# Patient Record
Sex: Male | Born: 1975 | Race: White | Hispanic: No | Marital: Married | Smoking: Former smoker
Health system: Southern US, Community
[De-identification: ages and names within clinical notes are randomized; demographics above are authoritative.]

## PROBLEM LIST (undated history)

## (undated) DIAGNOSIS — R112 Nausea with vomiting, unspecified: Secondary | ICD-10-CM

## (undated) DIAGNOSIS — T4145XA Adverse effect of unspecified anesthetic, initial encounter: Secondary | ICD-10-CM

## (undated) DIAGNOSIS — S46211A Strain of muscle, fascia and tendon of other parts of biceps, right arm, initial encounter: Secondary | ICD-10-CM

## (undated) DIAGNOSIS — Z9889 Other specified postprocedural states: Secondary | ICD-10-CM

## (undated) DIAGNOSIS — E039 Hypothyroidism, unspecified: Secondary | ICD-10-CM

## (undated) DIAGNOSIS — T8859XA Other complications of anesthesia, initial encounter: Secondary | ICD-10-CM

## (undated) HISTORY — PX: SHOULDER SURGERY: SHX246

---

## 2002-06-25 ENCOUNTER — Ambulatory Visit (HOSPITAL_COMMUNITY): Admission: RE | Admit: 2002-06-25 | Discharge: 2002-06-25 | Payer: Self-pay | Admitting: Family Medicine

## 2002-06-25 ENCOUNTER — Encounter: Payer: Self-pay | Admitting: Family Medicine

## 2002-07-19 ENCOUNTER — Ambulatory Visit (HOSPITAL_COMMUNITY): Admission: RE | Admit: 2002-07-19 | Discharge: 2002-07-19 | Payer: Self-pay | Admitting: Otolaryngology

## 2002-07-19 ENCOUNTER — Encounter: Payer: Self-pay | Admitting: Otolaryngology

## 2002-09-11 ENCOUNTER — Ambulatory Visit (HOSPITAL_COMMUNITY): Admission: RE | Admit: 2002-09-11 | Discharge: 2002-09-11 | Payer: Self-pay | Admitting: Otolaryngology

## 2002-09-11 ENCOUNTER — Encounter: Payer: Self-pay | Admitting: Otolaryngology

## 2004-07-04 ENCOUNTER — Encounter: Admission: RE | Admit: 2004-07-04 | Discharge: 2004-07-04 | Payer: Self-pay | Admitting: Orthopedic Surgery

## 2004-10-07 ENCOUNTER — Ambulatory Visit (HOSPITAL_COMMUNITY): Admission: RE | Admit: 2004-10-07 | Discharge: 2004-10-07 | Payer: Self-pay | Admitting: Family Medicine

## 2008-01-11 ENCOUNTER — Observation Stay (HOSPITAL_COMMUNITY): Admission: EM | Admit: 2008-01-11 | Discharge: 2008-01-12 | Payer: Self-pay | Admitting: Emergency Medicine

## 2009-05-13 ENCOUNTER — Ambulatory Visit (HOSPITAL_COMMUNITY): Admission: RE | Admit: 2009-05-13 | Discharge: 2009-05-13 | Payer: Self-pay | Admitting: Internal Medicine

## 2009-05-16 ENCOUNTER — Ambulatory Visit (HOSPITAL_COMMUNITY): Admission: RE | Admit: 2009-05-16 | Discharge: 2009-05-16 | Payer: Self-pay | Admitting: Internal Medicine

## 2010-11-17 NOTE — Discharge Summary (Signed)
NAMEMARQUINN, Jerry Owens              ACCOUNT NO.:  [id number]   MEDICAL RECORD NO.:  [id number]          PATIENT TYPE:  OBV   LOCATION:  4742                         FACILITY:  [facility]   PHYSICIAN:  [name], MD  DATE OF BIRTH:  1975-10-26   DATE OF ADMISSION:  01/11/2008  DATE OF DISCHARGE:  01/12/2008                               DISCHARGE SUMMARY   CHIEF COMPLAINT:  Diaphoresis and abdominal discomfort.   HOSPITAL COURSE:  1. Diaphoresis and abdominal discomfort.  The patient was ruled out      for acute coronary syndromes/myocardial infarction by serial EKG      tracings and serial cardiac enzymes.  The patient was scheduled for      2-D echo, but he is unwilling to wait for the 2-D echo, and because      the patient is stable he will be discharged now and it is      recommended that 2-D echo be done as an outpatient.  2. Slight elevation on TSH.  The patient is aware of this problem and      I had lengthy discussion with him and his wife in the room about      this result.  It is recommended that the patient have full thyroid      function evaluation.  He wanted to follow up with Dr. Sherwood Gambler for a      repeat of full thyroid function test.  3. Mild dyslipidemia.  The patient was counseled for lifestyle      modification.   DISCHARGE DIAGNOSES:  1. Gastric discomfort.  The patient is ruled out for acute coronary      syndrome.  2. Slight elevation in thyroid stimulating hormone.  3. Mild dyslipidemia.   DISCHARGE MEDICATION:  Advil p.r.n.   DISCHARGE PLAN:  The patient is discharged to follow up with Dr. Sherwood Gambler  as discussed with him to further assess the need for cardiac  stratification as well as the follow up of thyroid function tests and  lipid management.  The patient and his wife are aware and agreeable to  discharge plan and is stable for discharge today.      [name], MD  Electronically Signed     EME/MEDQ  D:  01/12/2008  T:  01/13/2008  Job:   [id number]

## 2010-11-17 NOTE — H&P (Signed)
Jerry Owens, Jerry Owens              ACCOUNT NO.:  [id number]   MEDICAL RECORD NO.:  [id number]          PATIENT TYPE:  OBV   LOCATION:  4742                         FACILITY:  [facility]   PHYSICIAN:  [name], MD  DATE OF BIRTH:  [birth year]   DATE OF ADMISSION:  01/11/2008  DATE OF DISCHARGE:                              HISTORY & PHYSICAL   PRIMARY CARE PHYSICIAN:  Dr. Elfredia Nevins.   CHIEF COMPLAINT:  Chest discomfort.   HISTORY OF PRESENT ILLNESS:  This is a 35 year old pleasant Caucasian  male with past medical history significant for chronic back pains came  in today to the emergency department after calling his primary physician  for chest discomfort.  The patient experienced chest discomfort  yesterday around 4 to 5 o'clock in the afternoon while he is working in  his garden.  The discomfort is associated with shortness of breath and  diaphoresis.  The patient also noticed dizziness and almost near  syncope.  The patient then felt better after a period of more than half  an hour of heavy sweating.  The patient had similar chest discomfort,  excessive sweating, and generalized fatigue around 9 o'clock last night.  Over the night, the patient slept okay but woke up in the morning very  tired and fatigued and then he called his primary physician who advised  him to present to the emergency department.  The chest discomfort is  mainly in the epigastric area.  It is squeezing and stabbing in nature,  8/10 when it is worse.  The condition is worsened by deep breathing and  improves a slight at rest.  The patient has family history of coronary  artery disease in his father's side.   PAST MEDICAL HISTORY:  Significant for on and off back pain.   SOCIAL HISTORY:  Denied ethanol abuse but he stated he is a social  drinker.  No tobacco abuse or drug abuse.   FAMILY HISTORY:  Significant for myocardial infarction in his father at  age of 51.   MEDICATION ALLERGIES:  Not known  to have medication allergy.   MEDICATION:  1. Ansaid.  2. Propecia.   REVIEW OF SYSTEMS:  A 12-point review of systems is noncontributory.  The rest as per HPI.   PHYSICAL EXAMINATION:  VITAL SIGNS:  Temperature 98.3, blood pressure  125/82, pulse 61, and respiratory rate is 17.  HEENT:  Head, atraumatic and normocephalic. Eyes, PERRL.  Mouth moist.  No ulcer.  Some skin change in the skin of the chin and in the mandible  area.  NECK:  Supple.  No JVD.  HEART:  Precordium, first and second heart sound audible.  No added  sounds.  LUNGS:  Bilateral fair air entry.  No adventitious sounds.  ABDOMEN:  Soft and nontender.  Bowel sounds present.  EXTREMITIES:  No lower extremities edema.  NEUROLOGIC:  Alert and oriented x3, moving all his extremities  spontaneously.  GENITOURINARY:  No CVA tenderness.   LABORATORY AND X-RAY:  Hemoglobin is 16.8, hematocrit is 49.1, platelet  count is 293, and white blood count is 5.4.  Sodium 139, potassium 3.9,  chloride 107, BUN 15, creatinine 0.9, glucose 93.  Chest x-ray showing  low lung volumes with mild peribronchial thickening, stable from 2006.  Troponin is less than 0.05, myoglobin is 39.1, CK-MB is 1.1.  EKG is  showing sinus rhythm with ventricular rate of 62 beat/minute.  Premature  atrial contractions.  Normal axis, normal VQRS intervals.   ASSESSMENT AND PLAN:  A 35 year old with chest discomfort, diaphoresis,  and dizziness/near syncope.  He will be admitted for ruling out acute  coronary syndrome.  1. The patient will be admitted to telemetry floor.  2. We will cycle cardiac enzymes.  3. We will have EKG tracings every 8 hours.  4. We will consider 2-D echo.  5. We will check his fasting lipid profile as well as his TSH.  6. For gastrointestinal prophylaxis I will start Protonix.  7. For deep venous thrombosis prophylaxis we will start Lovenox.      [name], MD  Electronically Signed     EME/MEDQ  D:   01/11/2008  T:  01/12/2008  Job:  [id number]   cc:   Madelin Rear. Sherwood Gambler, MD

## 2011-04-01 LAB — DIFFERENTIAL
Basophils Relative: 0
Lymphs Abs: 3
Monocytes Relative: 7
Neutro Abs: 7.4
Neutrophils Relative %: 65

## 2011-04-01 LAB — BASIC METABOLIC PANEL
Calcium: 9.2
GFR calc Af Amer: >60
GFR calc non Af Amer: >60
Glucose, Bld: 73
Potassium: 4.2
Sodium: 139

## 2011-04-01 LAB — POCT I-STAT, CHEM 8
Chloride: 107
Creatinine, Ser: 0.9
Glucose, Bld: 93
HCT: 52
Potassium: 3.9
Sodium: 139

## 2011-04-01 LAB — LIPID PANEL
Cholesterol: 262 — ABNORMAL HIGH
HDL: 32 — ABNORMAL LOW
LDL Cholesterol: 180 — ABNORMAL HIGH
Triglycerides: 248 — ABNORMAL HIGH

## 2011-04-01 LAB — CARDIAC PANEL(CRET KIN+CKTOT+MB+TROPI)
CK, MB: 1
CK, MB: 1.3
Relative Index: 1.1
Total CK: 122
Troponin I: <0.01

## 2011-04-01 LAB — CBC
HCT: 47.3
Hemoglobin: 16
Platelets: 293
RBC: 5.17
RBC: 5.43
WBC: 10.8 — ABNORMAL HIGH
WBC: 11.5 — ABNORMAL HIGH

## 2011-04-01 LAB — CK TOTAL AND CKMB (NOT AT ARMC): Relative Index: 0.9

## 2011-04-01 LAB — POCT CARDIAC MARKERS
CKMB, poc: 1.1
Myoglobin, poc: 39.1
Troponin i, poc: <0.05

## 2011-04-01 LAB — PHOSPHORUS: Phosphorus: 4.1

## 2011-04-01 LAB — TSH: TSH: 5.738 — ABNORMAL HIGH

## 2015-05-09 ENCOUNTER — Other Ambulatory Visit: Payer: Self-pay | Admitting: Orthopaedic Surgery

## 2015-05-09 DIAGNOSIS — M25532 Pain in left wrist: Secondary | ICD-10-CM

## 2015-05-09 DIAGNOSIS — M25522 Pain in left elbow: Secondary | ICD-10-CM

## 2015-05-28 ENCOUNTER — Other Ambulatory Visit: Payer: Self-pay | Admitting: Orthopaedic Surgery

## 2015-05-28 DIAGNOSIS — M25522 Pain in left elbow: Secondary | ICD-10-CM

## 2015-05-28 DIAGNOSIS — M25532 Pain in left wrist: Secondary | ICD-10-CM

## 2015-06-04 ENCOUNTER — Ambulatory Visit
Admission: RE | Admit: 2015-06-04 | Discharge: 2015-06-04 | Disposition: A | Discharge status: Home / Self Care | Payer: Managed Care, Other (non HMO) | Source: Ambulatory Visit | Attending: Orthopaedic Surgery | Admitting: Orthopaedic Surgery

## 2015-06-04 ENCOUNTER — Ambulatory Visit
Admission: RE | Admit: 2015-06-04 | Discharge: 2015-06-04 | Disposition: A | Discharge status: Home / Self Care | Payer: Self-pay | Source: Ambulatory Visit | Attending: Orthopaedic Surgery | Admitting: Orthopaedic Surgery

## 2015-06-04 DIAGNOSIS — M25522 Pain in left elbow: Secondary | ICD-10-CM

## 2015-06-04 DIAGNOSIS — M25532 Pain in left wrist: Secondary | ICD-10-CM

## 2015-06-04 MED ORDER — IOHEXOL 180 MG/ML  SOLN
1.0000 mL | Freq: Once | INTRAMUSCULAR | Status: AC | PRN
  Administered 2015-06-04: 1.5 mL via INTRA_ARTICULAR

## 2015-11-06 ENCOUNTER — Other Ambulatory Visit: Payer: Self-pay | Admitting: Gastroenterology

## 2015-11-18 ENCOUNTER — Other Ambulatory Visit (HOSPITAL_COMMUNITY): Payer: Self-pay | Admitting: Internal Medicine

## 2015-11-18 DIAGNOSIS — R002 Palpitations: Secondary | ICD-10-CM | POA: Insufficient documentation

## 2015-11-18 DIAGNOSIS — F4489 Other dissociative and conversion disorders: Secondary | ICD-10-CM | POA: Insufficient documentation

## 2015-11-18 DIAGNOSIS — R9431 Abnormal electrocardiogram [ECG] [EKG]: Secondary | ICD-10-CM | POA: Insufficient documentation

## 2015-11-19 ENCOUNTER — Other Ambulatory Visit: Payer: Self-pay

## 2015-11-19 ENCOUNTER — Ambulatory Visit (INDEPENDENT_AMBULATORY_CARE_PROVIDER_SITE_OTHER): Payer: Managed Care, Other (non HMO)

## 2015-11-19 ENCOUNTER — Ambulatory Visit (HOSPITAL_COMMUNITY): Payer: Managed Care, Other (non HMO) | Attending: Cardiology

## 2015-11-19 DIAGNOSIS — R002 Palpitations: Secondary | ICD-10-CM

## 2015-11-19 DIAGNOSIS — R9431 Abnormal electrocardiogram [ECG] [EKG]: Secondary | ICD-10-CM

## 2015-11-19 DIAGNOSIS — F4489 Other dissociative and conversion disorders: Secondary | ICD-10-CM

## 2015-11-26 ENCOUNTER — Other Ambulatory Visit: Payer: Self-pay

## 2015-12-04 ENCOUNTER — Ambulatory Visit (INDEPENDENT_AMBULATORY_CARE_PROVIDER_SITE_OTHER): Payer: Managed Care, Other (non HMO) | Admitting: Neurology

## 2015-12-04 DIAGNOSIS — R410 Disorientation, unspecified: Secondary | ICD-10-CM | POA: Diagnosis not present

## 2015-12-04 NOTE — Procedures (Signed)
    History:  Jerry Owens is a 40 year old gentleman with a history of episodes of confusion. The patient will blanch in the face, and be diaphoretic, he reports palpitations of the heart. No actual loss of consciousness is described. The patient is being evaluated for these events.  This is a routine EEG. No skull defects are noted. Medications include finasteride, and Synthroid.   EEG classification: Normal awake  Description of the recording: The background rhythms of this recording consists of a fairly well modulated medium amplitude alpha rhythm of 10 Hz that is reactive to eye opening and closure. As the record progresses, the patient appears to remain in the waking state throughout the recording. Photic stimulation was not performed. Hyperventilation was performed, resulting in a minimal buildup of the background rhythm activities without significant slowing seen. At no time during the recording does there appear to be evidence of spike or spike wave discharges or evidence of focal slowing. EKG monitor shows no evidence of cardiac rhythm abnormalities with a heart rate of 66.  Impression: This is a normal EEG recording in the waking state. No evidence of ictal or interictal discharges are seen.

## 2015-12-08 ENCOUNTER — Encounter (HOSPITAL_COMMUNITY): Payer: Self-pay | Admitting: *Deleted

## 2015-12-22 ENCOUNTER — Ambulatory Visit (HOSPITAL_COMMUNITY): Payer: Managed Care, Other (non HMO) | Admitting: Anesthesiology

## 2015-12-22 ENCOUNTER — Encounter (HOSPITAL_COMMUNITY): Payer: Self-pay

## 2015-12-22 ENCOUNTER — Ambulatory Visit (HOSPITAL_COMMUNITY)
Admission: RE | Admit: 2015-12-22 | Discharge: 2015-12-22 | Disposition: A | Discharge status: Home / Self Care | Payer: Managed Care, Other (non HMO) | Source: Ambulatory Visit | Attending: Gastroenterology | Admitting: Gastroenterology

## 2015-12-22 ENCOUNTER — Encounter (HOSPITAL_COMMUNITY)
Admission: RE | Disposition: A | Discharge status: Home / Self Care | Payer: Self-pay | Source: Ambulatory Visit | Attending: Gastroenterology

## 2015-12-22 DIAGNOSIS — E039 Hypothyroidism, unspecified: Secondary | ICD-10-CM | POA: Diagnosis not present

## 2015-12-22 DIAGNOSIS — Z1211 Encounter for screening for malignant neoplasm of colon: Secondary | ICD-10-CM | POA: Diagnosis not present

## 2015-12-22 DIAGNOSIS — Z79899 Other long term (current) drug therapy: Secondary | ICD-10-CM | POA: Diagnosis not present

## 2015-12-22 DIAGNOSIS — Z87891 Personal history of nicotine dependence: Secondary | ICD-10-CM | POA: Insufficient documentation

## 2015-12-22 DIAGNOSIS — Z86010 Personal history of colonic polyps: Secondary | ICD-10-CM | POA: Insufficient documentation

## 2015-12-22 HISTORY — DX: Other specified postprocedural states: R11.2

## 2015-12-22 HISTORY — DX: Hypothyroidism, unspecified: E03.9

## 2015-12-22 HISTORY — DX: Other complications of anesthesia, initial encounter: T88.59XA

## 2015-12-22 HISTORY — DX: Other specified postprocedural states: Z98.890

## 2015-12-22 HISTORY — DX: Adverse effect of unspecified anesthetic, initial encounter: T41.45XA

## 2015-12-22 HISTORY — PX: COLONOSCOPY WITH PROPOFOL: SHX5780

## 2015-12-22 LAB — HM COLONOSCOPY

## 2015-12-22 SURGERY — COLONOSCOPY WITH PROPOFOL
Anesthesia: Monitor Anesthesia Care

## 2015-12-22 MED ORDER — PROPOFOL 500 MG/50ML IV EMUL
INTRAVENOUS | Status: DC | PRN
  Administered 2015-12-22: 140 ug/kg/min via INTRAVENOUS

## 2015-12-22 MED ORDER — LACTATED RINGERS IV SOLN
INTRAVENOUS | Status: DC | PRN
  Administered 2015-12-22: 10:00:00 via INTRAVENOUS

## 2015-12-22 MED ORDER — LIDOCAINE HCL (CARDIAC) 20 MG/ML IV SOLN
INTRAVENOUS | Status: AC
  Filled 2015-12-22: qty 5

## 2015-12-22 MED ORDER — SODIUM CHLORIDE 0.9 % IV SOLN: INTRAVENOUS | Status: DC

## 2015-12-22 MED ORDER — PROPOFOL 10 MG/ML IV BOLUS
INTRAVENOUS | Status: AC
  Filled 2015-12-22: qty 40

## 2015-12-22 MED ORDER — PROPOFOL 500 MG/50ML IV EMUL
INTRAVENOUS | Status: DC | PRN
  Administered 2015-12-22: 70 mg via INTRAVENOUS

## 2015-12-22 SURGICAL SUPPLY — 22 items

## 2015-12-22 NOTE — Op Note (Addendum)
[facility] Patient Name: Jerry Owens Procedure Date: 12/22/2015 MRN: [id number] Attending MD: Charolett Bumpers , MD Date of Birth: [birth year] CSN: [id number] Age: 40 Admit Type: Outpatient Procedure:                Colonoscopy Indications:              High risk colon cancer surveillance: 08/10/2010                            diagnostic colonoscopy to evaluate hematochezia was                            performed and a 3 mm sigmoid colon tubular                            adenomatous polyp was removed Providers:                Charolett Bumpers, MD, Michel Bickers, RN, Beryle Beams, Technician, Roni Bread, CRNA Referring MD:              Medicines:                Propofol per Anesthesia Complications:            No immediate complications. Estimated Blood Loss:     Estimated blood loss: none. Procedure:                Pre-Anesthesia Assessment:                           - Prior to the procedure, a History and Physical                            was performed, and patient medications and                            allergies were reviewed. The patient's tolerance of                            previous anesthesia was also reviewed. The risks                            and benefits of the procedure and the sedation                            options and risks were discussed with the patient.                            All questions were answered, and informed consent                            was obtained. Prior Anticoagulants: The patient has  taken no previous anticoagulant or antiplatelet                            agents. ASA Grade Assessment: II - A patient with                            mild systemic disease. After reviewing the risks                            and benefits, the patient was deemed in                            satisfactory condition to undergo the procedure.  After obtaining informed consent, the colonoscope                            was passed under direct vision. Throughout the                            procedure, the patient's blood pressure, pulse, and                            oxygen saturations were monitored continuously. The                            Colonoscope was introduced through the anus and                            advanced to the the cecum, identified by                            appendiceal orifice and ileocecal valve. The                            colonoscopy was performed without difficulty. The                            patient tolerated the procedure well. The quality                            of the bowel preparation was good. The terminal                            ileum, the ileocecal valve, the appendiceal orifice                            and the rectum were photographed. Scope In: Scope Out: 10:45:13 AM Scope Withdrawal Time: 0 hours 8 minutes 11 seconds  Findings:      The perianal and digital rectal examinations were normal.      The entire examined colon appeared normal. Impression:               - The entire examined colon is normal.                           -  No specimens collected. Moderate Sedation:      N/A- Per Anesthesia Care Recommendation:           - Patient has a contact number available for                            emergencies. The signs and symptoms of potential                            delayed complications were discussed with the                            patient. Return to normal activities tomorrow.                            Written discharge instructions were provided to the                            patient.                           - Repeat colonoscopy in 10 years for surveillance.                           - Resume previous diet.                           - Continue present medications. Procedure Code(s):        --- Professional ---                           [phone],  Colonoscopy, flexible; diagnostic, including                            collection of specimen(s) by brushing or washing,                            when performed (separate procedure) Diagnosis Code(s):        --- Professional ---                           Z86.010, Personal history of colonic polyps CPT copyright 2016 American Medical Association. All rights reserved. The codes documented in this report are preliminary and upon coder review may  be revised to meet current compliance requirements. Danise EdgeMartin Anniya Whiters, MD Charolett BumpersMartin K Danille Oppedisano, MD 12/22/2015 10:51:16 AM This report has been signed electronically. Number of Addenda: 0

## 2015-12-22 NOTE — Anesthesia Preprocedure Evaluation (Addendum)
Anesthesia Evaluation  Patient identified by MRN, date of birth, ID band Patient awake    Reviewed: Allergy & Precautions, NPO status , Patient's Chart, lab work & pertinent test results  Airway Mallampati: I  TM Distance: >3 FB Neck ROM: Full    Dental  (+) Teeth Intact   Pulmonary former smoker,    breath sounds clear to auscultation       Cardiovascular negative cardio ROS   Rhythm:Regular Rate:Normal     Neuro/Psych    GI/Hepatic negative GI ROS, Neg liver ROS,   Endo/Other  Hypothyroidism   Renal/GU negative Renal ROS     Musculoskeletal   Abdominal   Peds  Hematology negative hematology ROS (+)   Anesthesia Other Findings   Reproductive/Obstetrics                           Anesthesia Physical Anesthesia Plan  ASA: I  Anesthesia Plan: MAC   Post-op Pain Management:    Induction: Intravenous  Airway Management Planned: Natural Airway and Simple Face Mask  Additional Equipment:   Intra-op Plan:   Post-operative Plan: Extubation in OR  Informed Consent: I have reviewed the patients History and Physical, chart, labs and discussed the procedure including the risks, benefits and alternatives for the proposed anesthesia with the patient or authorized representative who has indicated his/her understanding and acceptance.   Dental advisory given  Plan Discussed with: CRNA and Surgeon  Anesthesia Plan Comments:         Anesthesia Quick Evaluation
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
## 2015-12-22 NOTE — Discharge Instructions (Signed)
Colonoscopy, Care After °Refer to this sheet in the next few weeks. These instructions provide you with information on caring for yourself after your procedure. Your health care provider may also give you more specific instructions. Your treatment has been planned according to current medical practices, but problems sometimes occur. Call your health care provider if you have any problems or questions after your procedure. °WHAT TO EXPECT AFTER THE PROCEDURE  °After your procedure, it is typical to have the following: °· A small amount of blood in your stool. °· Moderate amounts of gas and mild abdominal cramping or bloating. °HOME CARE INSTRUCTIONS °· Do not drive, operate machinery, or sign important documents for 24 hours. °· You may shower and resume your regular physical activities, but move at a slower pace for the first 24 hours. °· Take frequent rest periods for the first 24 hours. °· Walk around or put a warm pack on your abdomen to help reduce abdominal cramping and bloating. °· Drink enough fluids to keep your urine clear or pale yellow. °· You may resume your normal diet as instructed by your health care provider. Avoid heavy or fried foods that are hard to digest. °· Avoid drinking alcohol for 24 hours or as instructed by your health care provider. °· Only take over-the-counter or prescription medicines as directed by your health care provider. °· If a tissue sample (biopsy) was taken during your procedure: °¨ Do not take aspirin or blood thinners for 7 days, or as instructed by your health care provider. °¨ Do not drink alcohol for 7 days, or as instructed by your health care provider. °¨ Eat soft foods for the first 24 hours. °SEEK MEDICAL CARE IF: °You have persistent spotting of blood in your stool 2-3 days after the procedure. °SEEK IMMEDIATE MEDICAL CARE IF: °· You have more than a small spotting of blood in your stool. °· You pass large blood clots in your stool. °· Your abdomen is swollen  (distended). °· You have nausea or vomiting. °· You have a fever. °· You have increasing abdominal pain that is not relieved with medicine. °  °This information is not intended to replace advice given to you by your health care provider. Make sure you discuss any questions you have with your health care provider. °  °Document Released: 02/03/2004 Document Revised: 04/11/2013 Document Reviewed: 02/26/2013 °Elsevier Interactive Patient Education ©2016 [employer] ° °

## 2015-12-22 NOTE — Transfer of Care (Signed)
Immediate Anesthesia Transfer of Care Note  Patient: Jerry Owens  Procedure(s) Performed: Procedure(s): COLONOSCOPY WITH PROPOFOL (N/A)  Patient Location: PACU  Anesthesia Type:MAC  Level of Consciousness: awake, alert  and oriented  Airway & Oxygen Therapy: Patient Spontanous Breathing and Patient connected to face mask oxygen  Post-op Assessment: Report given to RN and Post -op Vital signs reviewed and stable  Post vital signs: Reviewed and stable  Last Vitals:  Filed Vitals:   12/22/15 1007  BP: 121/75  Pulse: 56  Temp: 36.8 C  Resp: 12    Last Pain: There were no vitals filed for this visit.       Complications: No apparent anesthesia complications

## 2015-12-23 ENCOUNTER — Encounter (HOSPITAL_COMMUNITY): Payer: Self-pay | Admitting: Gastroenterology

## 2015-12-24 NOTE — Anesthesia Postprocedure Evaluation (Signed)
Anesthesia Post Note  Patient: Jerry Owens  Procedure(s) Performed: Procedure(s) (LRB): COLONOSCOPY WITH PROPOFOL (N/A)  Patient location during evaluation: Endoscopy Anesthesia Type: MAC Level of consciousness: awake and alert Pain management: pain level controlled Vital Signs Assessment: post-procedure vital signs reviewed and stable Respiratory status: spontaneous breathing, nonlabored ventilation, respiratory function stable and patient connected to nasal cannula oxygen Cardiovascular status: stable and blood pressure returned to baseline Anesthetic complications: no    Last Vitals:  Filed Vitals:   12/22/15 1007 12/22/15 1055  BP: 121/75 137/69  Pulse: 56 73  Temp: 36.8 C 36.6 C  Resp: 12 18    Last Pain: There were no vitals filed for this visit.               Miliani Deike,JAMES TERRILL

## 2016-05-20 ENCOUNTER — Ambulatory Visit (INDEPENDENT_AMBULATORY_CARE_PROVIDER_SITE_OTHER): Payer: Managed Care, Other (non HMO) | Admitting: Orthopaedic Surgery

## 2016-05-20 ENCOUNTER — Encounter (INDEPENDENT_AMBULATORY_CARE_PROVIDER_SITE_OTHER): Payer: Self-pay | Admitting: Orthopaedic Surgery

## 2016-05-20 DIAGNOSIS — M7521 Bicipital tendinitis, right shoulder: Secondary | ICD-10-CM | POA: Diagnosis not present

## 2016-05-20 DIAGNOSIS — M7541 Impingement syndrome of right shoulder: Secondary | ICD-10-CM | POA: Diagnosis not present

## 2016-05-20 MED ORDER — PREDNISONE 10 MG (21) PO TBPK: 10.0000 mg | ORAL_TABLET | Freq: Every day | ORAL | 0 refills | Status: DC

## 2016-05-20 MED ORDER — DICLOFENAC SODIUM 1 % TD GEL: 2.0000 g | Freq: Four times a day (QID) | TRANSDERMAL | 5 refills | Status: DC

## 2016-05-20 NOTE — Progress Notes (Signed)
Office Visit Note   Patient: Jerry Owens           Date of Birth: 01/29/1976           MRN: [id number] Visit Date: 05/20/2016              Requested by: Renford Dillsonald Polite, MD [address] [place], [place] [address] PCP: Katy ApoPOLITE,RONALD D, MD   Assessment & Plan: Visit Diagnoses:  1. Impingement syndrome of right shoulder   2. Biceps tendinitis on right     Plan: I went back and reviewed an old MRI which did show tendinosis of the distal biceps. I stressed the importance of rest. I'm hesitant to perform another injection as it could cause tendon rupture which would necessitate surgery. Elbow compression sleeve was provided today work note for no use of right arm for 6 weeks was provided. Steroid taper prescribed today. For his right shoulder a subacromial injection was performed. Hopefully this will give him some relief. I would like to see him back in 6 weeks to see how he does with these conservative measures.  Follow-Up Instructions: Return in about 6 weeks (around 07/01/2016) for recheck right shoulder and elbow.   Orders:  No orders of the defined types were placed in this encounter.  Meds ordered this encounter  Medications  . diclofenac sodium (VOLTAREN) 1 % GEL    Sig: Apply 2 g topically 4 (four) times daily.    Dispense:  1 Tube    Refill:  5  . predniSONE (STERAPRED UNI-PAK 21 TAB) 10 MG (21) TBPK tablet    Sig: Take 1 tablet (10 mg total) by mouth daily. Take as directed    Dispense:  21 tablet    Refill:  0      Procedures: Large Joint Inj Date/Time: 05/20/2016 6:05 PM Performed by: Tarry KosXU, NAIPING M Authorized by: Tarry KosXU, NAIPING M   Consent Given by:  Patient Timeout: prior to procedure the correct patient, procedure, and site was verified   Indications:  Pain Location:  Shoulder Site:  R subacromial bursa Prep: patient was prepped and draped in usual sterile fashion   Needle Size:  22 G Approach:  Posterior Ultrasound Guidance: No     Fluoroscopic Guidance: No       Clinical Data: No additional findings.   Subjective: Chief Complaint  Patient presents with  . Right Shoulder - Pain, Follow-up  . Right Elbow - Pain, Follow-up    HPI Patient is a 40 year old gentleman who comes in with worsening of his right shoulder and elbow and distal biceps pain. I did see him in the past for similar issue. He did have one distal biceps injection which gave him a lot of relief he says that the pain has gotten worse over the last 3 months and it is 6 out of 10. It's worse with activity and better with rest. He also has a new pain in his right shoulder that does not radiate. Pain is worse with elevation of the arm. He has not taken any over-the-counter medicines. Review of Systems Negative except for history of present illness  Objective: Vital Signs: There were no vitals taken for this visit.  Physical Exam Well-developed well-nourished no acute distress alert and oriented 3 Ortho Exam Exam of the right shoulder shows a positive Hawkins sign. Rotator cuff is intact and strong but with mild pain with an T can testing. Negative O'Brien negative speed range of motion is normal.  Exam  of his right elbow shows tenderness to palpation directly over the distal biceps tendon. He has pain with resisted supination of the forearm. Moderate discomfort with resisted elbow flexion. Elbow extension does not cause him any pain. Specialty Comments:  No specialty comments available.  Imaging: No results found.   PMFS History: Patient Active Problem List   Diagnosis Date Noted  . Biceps tendinitis on right 05/20/2016  . Impingement syndrome of right shoulder 05/20/2016  . Palpitations 11/18/2015  . Confusional state 11/18/2015  . Nonspecific abnormal electrocardiogram (ECG) (EKG) 11/18/2015   Past Medical History:  Diagnosis Date  . Complication of anesthesia   . Hypothyroidism   . PONV (postoperative nausea and vomiting)      History reviewed. No pertinent family history.  Past Surgical History:  Procedure Laterality Date  . COLONOSCOPY WITH PROPOFOL N/A 12/22/2015   Procedure: COLONOSCOPY WITH PROPOFOL;  Surgeon: Charolett BumpersMartin K Johnson, MD;  Location: [facility];  Service: Endoscopy;  Laterality: N/A;  . SHOULDER SURGERY     left rotator cuff surgery   Social History   Occupational History  . Not on file.   Social History Main Topics  . Smoking status: Former Smoker    Types: Cigarettes    Quit date: 11/21/2004  . Smokeless tobacco: Not on file  . Alcohol use Yes     Comment: rarely  . Drug use: No  . Sexual activity: Not on file

## 2016-07-01 ENCOUNTER — Encounter (INDEPENDENT_AMBULATORY_CARE_PROVIDER_SITE_OTHER): Payer: Self-pay | Admitting: Orthopaedic Surgery

## 2016-07-01 ENCOUNTER — Ambulatory Visit (INDEPENDENT_AMBULATORY_CARE_PROVIDER_SITE_OTHER): Payer: Managed Care, Other (non HMO) | Admitting: Orthopaedic Surgery

## 2016-07-01 DIAGNOSIS — M7521 Bicipital tendinitis, right shoulder: Secondary | ICD-10-CM | POA: Diagnosis not present

## 2016-07-01 DIAGNOSIS — M7541 Impingement syndrome of right shoulder: Secondary | ICD-10-CM

## 2016-07-01 NOTE — Progress Notes (Signed)
Office Visit Note   Patient: Jerry Owens           Date of Birth: 08/22/1975           MRN: [id number] Visit Date: 07/01/2016              Requested by: Renford Dillsonald Polite, MD [address] [place], [place] [address] PCP: Katy ApoPOLITE,RONALD D, MD   Assessment & Plan: Visit Diagnoses:  1. Biceps tendinitis on right   2. Impingement syndrome of right shoulder     Plan: Impression is right distal biceps tendinitis. We will add iontophoresis with physical therapy. Injection was performed today I did tell him that this is last injection will give him in that area. I don't want to put him at increased risk for tendon rupture. He understands risks and he wished to proceed with the injection.  Follow-Up Instructions: Return if symptoms worsen or fail to improve.   Orders:  No orders of the defined types were placed in this encounter.  No orders of the defined types were placed in this encounter.     Procedures: Medium Joint Inj Date/Time: 07/01/2016 9:19 AM Performed by: Tarry KosXU, Royelle Hinchman M Authorized by: Tarry KosXU, Reema Chick M   Consent Given by:  Patient Indications:  Pain Location:  Elbow Site:  R elbow Needle Size:  25 G Ultrasound Guided: No   Fluoroscopic Guidance: No       Clinical Data: No additional findings.   Subjective: Chief Complaint  Patient presents with  . Right Shoulder - Pain, Follow-up  . Right Elbow - Pain, Follow-up    Patient is following up for his right shoulder and elbow pain. The shoulder is doing fine. Pain is 1-2 out of 10. Mainly pops but injection has helped. He is not really complaining much about the shoulder. Her the right elbow continues to hurt. Pain is still there. Worse with immobilization. Pain does not radiate. Previous injection did help. He is complaining of pain in the musculotendinous junction. Denies any paresthesias.    Review of Systems   Objective: Vital Signs: There were no vitals taken for this visit.  Physical  Exam  Ortho Exam Exam of the right elbow shows tenderness to palpation at the musculotendinous junction of the biceps muscle. Negative Tinel in the cubital tunnel negative Tinel in the antecubital fossa. Specialty Comments:  No specialty comments available.  Imaging: No results found.   PMFS History: Patient Active Problem List   Diagnosis Date Noted  . Biceps tendinitis on right 05/20/2016  . Impingement syndrome of right shoulder 05/20/2016  . Palpitations 11/18/2015  . Confusional state 11/18/2015  . Nonspecific abnormal electrocardiogram (ECG) (EKG) 11/18/2015   Past Medical History:  Diagnosis Date  . Complication of anesthesia   . Hypothyroidism   . PONV (postoperative nausea and vomiting)     No family history on file.  Past Surgical History:  Procedure Laterality Date  . COLONOSCOPY WITH PROPOFOL N/A 12/22/2015   Procedure: COLONOSCOPY WITH PROPOFOL;  Surgeon: Charolett BumpersMartin K Johnson, MD;  Location: [facility];  Service: Endoscopy;  Laterality: N/A;  . SHOULDER SURGERY     left rotator cuff surgery   Social History   Occupational History  . Not on file.   Social History Main Topics  . Smoking status: Former Smoker    Types: Cigarettes    Quit date: 11/21/2004  . Smokeless tobacco: Not on file  . Alcohol use Yes     Comment: rarely  .  Drug use: No  . Sexual activity: Not on file       

## 2016-08-09 ENCOUNTER — Telehealth (INDEPENDENT_AMBULATORY_CARE_PROVIDER_SITE_OTHER): Payer: Self-pay | Admitting: Orthopaedic Surgery

## 2016-08-09 NOTE — Telephone Encounter (Signed)
[name] CALLED FROM [facility] NEEDING A IMAGE REPORT, AND NERVE CONDUCTION TEST REPORT. CB # [phone]

## 2016-08-10 NOTE — Telephone Encounter (Signed)
FAXED

## 2016-08-16 ENCOUNTER — Telehealth (INDEPENDENT_AMBULATORY_CARE_PROVIDER_SITE_OTHER): Payer: Self-pay | Admitting: *Deleted

## 2016-08-16 NOTE — Telephone Encounter (Signed)
Marisue IvanLiz from [facility] called this afternoon in regards to needing a plan of care for physical therapy. CB # [phone]. Thank you

## 2016-08-16 NOTE — Telephone Encounter (Signed)
The date she is needing for this is 08-02-2016 and also for 08-09-2016. Thank you

## 2016-08-18 NOTE — Telephone Encounter (Signed)
Form was given back to Jerry Owens to be faxed back

## 2016-08-26 ENCOUNTER — Telehealth (INDEPENDENT_AMBULATORY_CARE_PROVIDER_SITE_OTHER): Payer: Self-pay | Admitting: Orthopaedic Surgery

## 2016-08-26 NOTE — Telephone Encounter (Signed)
That was already faxed 

## 2016-08-26 NOTE — Telephone Encounter (Signed)
Collier SalinaLiiz from [facility] called left voicemail message needing a signed plan of care 08/02/16  faxed to her from Dr Roda ShuttersXu. The fax# is [phone]   The Phone # is [phone]

## 2016-10-22 LAB — LIPID PANEL
CHOLESTEROL: 158 (ref 0–200)
HDL: 40 (ref 35–70)
LDL Cholesterol: 92
TRIGLYCERIDES: 129 (ref 40–160)

## 2016-10-22 LAB — BASIC METABOLIC PANEL
BUN: 13 (ref 4–21)
Creatinine: 0.9 (ref 0.6–1.3)
Glucose: 90
POTASSIUM: 4 (ref 3.4–5.3)
SODIUM: 139 (ref 137–147)

## 2016-10-22 LAB — HEPATIC FUNCTION PANEL
ALT: 45 — AB (ref 10–40)
AST: 20 (ref 14–40)
Alkaline Phosphatase: 56 (ref 25–125)
BILIRUBIN, TOTAL: 0.4

## 2016-10-22 LAB — CBC AND DIFFERENTIAL: Platelets: 352 (ref 150–399)

## 2016-10-22 LAB — TSH: TSH: 9.42 — AB (ref 0.41–5.90)

## 2016-10-27 LAB — CBC AND DIFFERENTIAL
HCT: 48 (ref 41–53)
HEMOGLOBIN: 16.3 (ref 13.5–17.5)
NEUTROS ABS: 11
WBC: 16.9

## 2016-10-29 ENCOUNTER — Encounter: Payer: Self-pay | Admitting: Hematology

## 2016-11-05 ENCOUNTER — Telehealth: Payer: Self-pay

## 2016-11-05 NOTE — Telephone Encounter (Signed)
Melissa, wife, called asking: his blood work is elevated at Dr [facility] IncPolite's office. Pt had an appt on 5/7 and it got changed to 6/6. She is asking how much difference will this make if he has cancer. On Monday her best phone number is [phone].

## 2016-11-08 ENCOUNTER — Ambulatory Visit: Payer: Managed Care, Other (non HMO) | Admitting: Hematology

## 2016-11-09 NOTE — Telephone Encounter (Signed)
"  Jerry FootsMelissa Howser calling for my husband a new patient for Dr. Candise CheKale.  He was scheduled for yesterday but the appointment was moved.  Will this affect cancer?  Will moving out thirty days make a big difference if he has cancer?  Call me at work number [phone]."

## 2016-11-10 ENCOUNTER — Telehealth: Payer: Self-pay | Admitting: Hematology

## 2016-11-10 ENCOUNTER — Encounter: Payer: Self-pay | Admitting: Hematology

## 2016-11-10 NOTE — Telephone Encounter (Signed)
Cld the pt's wife and gave a new appt for her husband to see Dr. Candise CheKale on 5/16 at 12pm. Agreed to the appt day and time. Aware to arrive 30 minutes early. Letter mailed.

## 2016-11-17 ENCOUNTER — Other Ambulatory Visit (HOSPITAL_COMMUNITY)
Admission: RE | Admit: 2016-11-17 | Discharge: 2016-11-17 | Disposition: A | Discharge status: Home / Self Care | Payer: Managed Care, Other (non HMO) | Source: Ambulatory Visit | Attending: Hematology | Admitting: Hematology

## 2016-11-17 ENCOUNTER — Ambulatory Visit (HOSPITAL_BASED_OUTPATIENT_CLINIC_OR_DEPARTMENT_OTHER): Payer: Managed Care, Other (non HMO)

## 2016-11-17 ENCOUNTER — Ambulatory Visit (HOSPITAL_BASED_OUTPATIENT_CLINIC_OR_DEPARTMENT_OTHER): Payer: Managed Care, Other (non HMO) | Admitting: Hematology

## 2016-11-17 ENCOUNTER — Telehealth: Payer: Self-pay | Admitting: Hematology

## 2016-11-17 ENCOUNTER — Other Ambulatory Visit: Payer: Self-pay | Admitting: *Deleted

## 2016-11-17 ENCOUNTER — Encounter: Payer: Self-pay | Admitting: Hematology

## 2016-11-17 VITALS — BP 131/75 | HR 63 | Temp 98.0°F | Resp 18 | Ht 68.0 in | Wt 198.5 lb

## 2016-11-17 DIAGNOSIS — D72829 Elevated white blood cell count, unspecified: Secondary | ICD-10-CM

## 2016-11-17 DIAGNOSIS — E039 Hypothyroidism, unspecified: Secondary | ICD-10-CM | POA: Diagnosis not present

## 2016-11-17 DIAGNOSIS — R300 Dysuria: Secondary | ICD-10-CM

## 2016-11-17 LAB — CBC & DIFF AND RETIC
BASO%: 0.4 % (ref 0.0–2.0)
Basophils Absolute: 0 10*3/uL (ref 0.0–0.1)
EOS%: 4.1 % (ref 0.0–7.0)
Eosinophils Absolute: 0.4 10*3/uL (ref 0.0–0.5)
HCT: 52 % — ABNORMAL HIGH (ref 38.4–49.9)
HGB: 17.6 g/dL — ABNORMAL HIGH (ref 13.0–17.1)
Immature Retic Fract: 4.6 % (ref 3.00–10.60)
LYMPH#: 3 10*3/uL (ref 0.9–3.3)
LYMPH%: 28.1 % (ref 14.0–49.0)
MCH: 31.2 pg (ref 27.2–33.4)
MCHC: 33.8 g/dL (ref 32.0–36.0)
MCV: 92 fL (ref 79.3–98.0)
MONO#: 0.9 10*3/uL (ref 0.1–0.9)
MONO%: 8.5 % (ref 0.0–14.0)
NEUT%: 58.9 % (ref 39.0–75.0)
NEUTROS ABS: 6.3 10*3/uL (ref 1.5–6.5)
PLATELETS: 257 10*3/uL (ref 140–400)
RBC: 5.65 10*6/uL (ref 4.20–5.82)
RDW: 13.1 % (ref 11.0–14.6)
RETIC CT ABS: 87.01 10*3/uL (ref 34.80–93.90)
Retic %: 1.54 % (ref 0.80–1.80)
WBC: 10.6 10*3/uL — AB (ref 4.0–10.3)

## 2016-11-17 LAB — URINALYSIS, MICROSCOPIC - CHCC
Bilirubin (Urine): NEGATIVE
Blood: NEGATIVE
Glucose: NEGATIVE mg/dL
Ketones: NEGATIVE mg/dL
LEUKOCYTE ESTERASE: NEGATIVE
Nitrite: NEGATIVE
PROTEIN: NEGATIVE mg/dL
Specific Gravity, Urine: 1.015 (ref 1.003–1.035)
UROBILINOGEN UR: 0.2 mg/dL (ref 0.2–1)
WBC, UA: NEGATIVE (ref 0–?)
pH: 6 (ref 4.6–8.0)

## 2016-11-17 LAB — COMPREHENSIVE METABOLIC PANEL
ALT: 52 U/L (ref 0–55)
ANION GAP: 8 meq/L (ref 3–11)
AST: 22 U/L (ref 5–34)
Albumin: 4.4 g/dL (ref 3.5–5.0)
Alkaline Phosphatase: 54 U/L (ref 40–150)
BUN: 8.9 mg/dL (ref 7.0–26.0)
CHLORIDE: 104 meq/L (ref 98–109)
CO2: 27 meq/L (ref 22–29)
Calcium: 9.6 mg/dL (ref 8.4–10.4)
Creatinine: 0.9 mg/dL (ref 0.7–1.3)
GLUCOSE: 93 mg/dL (ref 70–140)
Potassium: 4 mEq/L (ref 3.5–5.1)
SODIUM: 139 meq/L (ref 136–145)
Total Bilirubin: 0.78 mg/dL (ref 0.20–1.20)
Total Protein: 7.6 g/dL (ref 6.4–8.3)

## 2016-11-17 LAB — LACTATE DEHYDROGENASE: LDH: 180 U/L (ref 125–245)

## 2016-11-17 LAB — CHCC SMEAR

## 2016-11-17 NOTE — Telephone Encounter (Signed)
Gave patient AVS - scheduled lab appt - no additional appts scheduled per LOS - RTC based on lab results.Marland Kitchen..Marland Kitchen
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
## 2016-11-18 ENCOUNTER — Telehealth: Payer: Self-pay | Admitting: *Deleted

## 2016-11-18 LAB — SEDIMENTATION RATE: SED RATE: 4 mm/h (ref 0–15)

## 2016-11-18 NOTE — Telephone Encounter (Signed)
Patient informed and verbalized understanding

## 2016-11-18 NOTE — Telephone Encounter (Signed)
Left voicemail for patient to call [facility] back.

## 2016-11-18 NOTE — Telephone Encounter (Signed)
-----   Message from [name], MD sent at 11/17/2016  7:05 PM EDT ----- Plz let [name] know that his WBC counts have nearly normalized. Have him drink at atleast 48-64oz of water daily since his lab appear somewhat hemo-concentrated. We shall f/u on his remaining labs. thx GK

## 2016-11-19 LAB — FLOW CYTOMETRY

## 2016-12-08 ENCOUNTER — Ambulatory Visit: Payer: Managed Care, Other (non HMO) | Admitting: Hematology

## 2016-12-17 ENCOUNTER — Telehealth: Payer: Self-pay

## 2016-12-17 NOTE — Telephone Encounter (Signed)
Pt called to find out what his UA culture shows. LVM told pt that his results were fine and no need for medication. Call back number provided.

## 2017-03-09 ENCOUNTER — Telehealth: Payer: Self-pay | Admitting: *Deleted

## 2017-03-09 NOTE — Telephone Encounter (Signed)
No answer

## 2017-03-09 NOTE — Telephone Encounter (Signed)
-----   Message from Althia FortsSamantha M Rouse, RN sent at 03/09/2017  3:38 PM EDT ----- Let me know when completed. Send back if pt does not answer. Forwarding removes message from que. Thank you.  ----- Message ----- From: Johney MaineKale, Gautam Kishore, MD Sent: 03/09/2017  12:19 PM To: Renford Dillsonald Polite, MD, Althia FortsSamantha M Rouse, RN  Jak2 V617F Mutation and BCR-ABL mutation neg. No evidence of clonal mutations. GK

## 2017-05-12 ENCOUNTER — Ambulatory Visit (HOSPITAL_COMMUNITY)
Admission: RE | Admit: 2017-05-12 | Discharge: 2017-05-12 | Disposition: A | Discharge status: Home / Self Care | Payer: Managed Care, Other (non HMO) | Source: Ambulatory Visit | Attending: Orthopaedic Surgery | Admitting: Orthopaedic Surgery

## 2017-05-12 ENCOUNTER — Ambulatory Visit (INDEPENDENT_AMBULATORY_CARE_PROVIDER_SITE_OTHER): Payer: Managed Care, Other (non HMO)

## 2017-05-12 ENCOUNTER — Encounter (INDEPENDENT_AMBULATORY_CARE_PROVIDER_SITE_OTHER): Payer: Self-pay | Admitting: Orthopaedic Surgery

## 2017-05-12 ENCOUNTER — Ambulatory Visit (INDEPENDENT_AMBULATORY_CARE_PROVIDER_SITE_OTHER): Payer: Managed Care, Other (non HMO) | Admitting: Orthopaedic Surgery

## 2017-05-12 DIAGNOSIS — X58XXXA Exposure to other specified factors, initial encounter: Secondary | ICD-10-CM | POA: Insufficient documentation

## 2017-05-12 DIAGNOSIS — M19021 Primary osteoarthritis, right elbow: Secondary | ICD-10-CM | POA: Diagnosis not present

## 2017-05-12 DIAGNOSIS — M25521 Pain in right elbow: Secondary | ICD-10-CM

## 2017-05-12 DIAGNOSIS — S46211A Strain of muscle, fascia and tendon of other parts of biceps, right arm, initial encounter: Secondary | ICD-10-CM | POA: Insufficient documentation

## 2017-05-12 DIAGNOSIS — M7521 Bicipital tendinitis, right shoulder: Secondary | ICD-10-CM

## 2017-05-12 MED ORDER — TRAMADOL HCL 50 MG PO TABS: 50.0000 mg | ORAL_TABLET | Freq: Three times a day (TID) | ORAL | 2 refills | Status: DC | PRN

## 2017-05-12 NOTE — Progress Notes (Signed)
Office Visit Note   Patient: Jerry Owens           Date of Birth: 07/19/1975           MRN: [id number] Visit Date: 05/12/2017              Requested by: Renford DillsPolite, Ronald, MD [address] [place], [place] [address] PCP: Renford DillsPolite, Ronald, MD   Assessment & Plan: Visit Diagnoses:  1. Pain in right elbow   2. Biceps tendinitis on right     Plan: Impression is right distal biceps rupture.  Urgent MRI of the right elbow to evaluate for full-thickness rupture which would necessitate surgery.  Prescription for tramadol.  We will contact the patient regarding the MRI results.  Follow-Up Instructions: Return if symptoms worsen or fail to improve.   Orders:  Orders Placed This Encounter  Procedures  . XR Elbow 2 Views Right  . MR Elbow Right w/o contrast   Meds ordered this encounter  Medications  . traMADol (ULTRAM) 50 MG tablet    Sig: Take 1-2 tablets (50-100 mg total) 3 (three) times daily as needed by mouth.    Dispense:  30 tablet    Refill:  2      Procedures: No procedures performed   Clinical Data: No additional findings.   Subjective: Chief Complaint  Patient presents with  . Right Elbow - Pain    Jerry Owens comes in today for an acute injury to his right elbow.  He was lifting a piece of wood yesterday and felt a pop in his antecubital fossa.  He had immediate pain and swelling in this area.  He denies any significant bruising but he does endorse tingling and numbness.  He does have a history of distal biceps tendinosis which we found on MRI.  He is currently taking ibuprofen.    Review of Systems  Constitutional: Negative.   All other systems reviewed and are negative.    Objective: Vital Signs: There were no vitals taken for this visit.  Physical Exam  Constitutional: He is oriented to person, place, and time. He appears well-developed and well-nourished.  Pulmonary/Chest: Effort normal.  Abdominal: Soft.  Neurological: He is alert and  oriented to person, place, and time.  Skin: Skin is warm.  Psychiatric: He has a normal mood and affect. His behavior is normal. Judgment and thought content normal.  Nursing note and vitals reviewed.   Ortho Exam Right elbow shows tenderness in the antecubital fossa and just proximal to this.  Abnormal test.  Pain with resisted elbow flexion and supination. Specialty Comments:  No specialty comments available.  Imaging: No results found.   PMFS History: Patient Active Problem List   Diagnosis Date Noted  . Biceps tendinitis on right 05/20/2016  . Impingement syndrome of right shoulder 05/20/2016  . Palpitations 11/18/2015  . Confusional state 11/18/2015  . Nonspecific abnormal electrocardiogram (ECG) (EKG) 11/18/2015   Past Medical History:  Diagnosis Date  . Complication of anesthesia   . Hypothyroidism   . PONV (postoperative nausea and vomiting)     History reviewed. No pertinent family history.  Past Surgical History:  Procedure Laterality Date  . SHOULDER SURGERY     left rotator cuff surgery   Social History   Occupational History  . Not on file  Tobacco Use  . Smoking status: Former Smoker    Types: Cigarettes    Last attempt to quit: 11/21/2004    Years since quitting: 12.4  .  Smokeless tobacco: Never Used  Substance and Sexual Activity  . Alcohol use: Yes    Comment: rarely  . Drug use: No  . Sexual activity: Not on file

## 2017-05-12 NOTE — Progress Notes (Signed)
[facility] my block time.  Supine, mini c arm.  Tourniquet, general and regional, biomet.  1 week postop visit.  Outpatient.  CPT [id number].  Thanks.

## 2017-05-13 ENCOUNTER — Other Ambulatory Visit: Payer: Self-pay

## 2017-05-13 ENCOUNTER — Other Ambulatory Visit (INDEPENDENT_AMBULATORY_CARE_PROVIDER_SITE_OTHER): Payer: Self-pay | Admitting: Orthopaedic Surgery

## 2017-05-13 ENCOUNTER — Telehealth (INDEPENDENT_AMBULATORY_CARE_PROVIDER_SITE_OTHER): Payer: Self-pay | Admitting: Orthopaedic Surgery

## 2017-05-13 ENCOUNTER — Encounter (HOSPITAL_BASED_OUTPATIENT_CLINIC_OR_DEPARTMENT_OTHER): Payer: Self-pay | Admitting: *Deleted

## 2017-05-13 DIAGNOSIS — S46211A Strain of muscle, fascia and tendon of other parts of biceps, right arm, initial encounter: Secondary | ICD-10-CM

## 2017-05-13 NOTE — Telephone Encounter (Signed)
Patients wife called saying he needs a work note stating that he is going to have surgery in a couple of weeks (date has not been set) and that he can not return to work before the surgery and after the surgery. Call patient once note is complete [phone].

## 2017-05-16 ENCOUNTER — Encounter (INDEPENDENT_AMBULATORY_CARE_PROVIDER_SITE_OTHER): Payer: Self-pay

## 2017-05-16 NOTE — Telephone Encounter (Signed)
See message.

## 2017-05-16 NOTE — Telephone Encounter (Signed)
Ok, out of work for 6 weeks

## 2017-05-16 NOTE — Telephone Encounter (Signed)
Note made.  

## 2017-05-18 ENCOUNTER — Ambulatory Visit (HOSPITAL_BASED_OUTPATIENT_CLINIC_OR_DEPARTMENT_OTHER): Payer: Managed Care, Other (non HMO) | Admitting: Anesthesiology

## 2017-05-18 ENCOUNTER — Ambulatory Visit (HOSPITAL_BASED_OUTPATIENT_CLINIC_OR_DEPARTMENT_OTHER)
Admission: RE | Admit: 2017-05-18 | Discharge: 2017-05-18 | Disposition: A | Discharge status: Home / Self Care | Payer: Managed Care, Other (non HMO) | Source: Ambulatory Visit | Attending: Orthopaedic Surgery | Admitting: Orthopaedic Surgery

## 2017-05-18 ENCOUNTER — Encounter (HOSPITAL_BASED_OUTPATIENT_CLINIC_OR_DEPARTMENT_OTHER)
Admission: RE | Disposition: A | Discharge status: Home / Self Care | Payer: Self-pay | Source: Ambulatory Visit | Attending: Orthopaedic Surgery

## 2017-05-18 ENCOUNTER — Encounter (HOSPITAL_BASED_OUTPATIENT_CLINIC_OR_DEPARTMENT_OTHER): Payer: Self-pay | Admitting: *Deleted

## 2017-05-18 DIAGNOSIS — X58XXXA Exposure to other specified factors, initial encounter: Secondary | ICD-10-CM | POA: Insufficient documentation

## 2017-05-18 DIAGNOSIS — Z79899 Other long term (current) drug therapy: Secondary | ICD-10-CM | POA: Insufficient documentation

## 2017-05-18 DIAGNOSIS — S46211A Strain of muscle, fascia and tendon of other parts of biceps, right arm, initial encounter: Secondary | ICD-10-CM | POA: Insufficient documentation

## 2017-05-18 DIAGNOSIS — Z87891 Personal history of nicotine dependence: Secondary | ICD-10-CM | POA: Insufficient documentation

## 2017-05-18 DIAGNOSIS — Z79891 Long term (current) use of opiate analgesic: Secondary | ICD-10-CM | POA: Diagnosis not present

## 2017-05-18 DIAGNOSIS — Z880 Allergy status to penicillin: Secondary | ICD-10-CM | POA: Diagnosis not present

## 2017-05-18 DIAGNOSIS — Z79890 Hormone replacement therapy (postmenopausal): Secondary | ICD-10-CM | POA: Diagnosis not present

## 2017-05-18 DIAGNOSIS — E039 Hypothyroidism, unspecified: Secondary | ICD-10-CM | POA: Insufficient documentation

## 2017-05-18 HISTORY — DX: Strain of muscle, fascia and tendon of other parts of biceps, right arm, initial encounter: S46.211A

## 2017-05-18 HISTORY — PX: DISTAL BICEPS TENDON REPAIR: SHX1461

## 2017-05-18 SURGERY — REPAIR, TENDON, BICEPS, DISTAL
Anesthesia: Regional | Site: Arm Upper | Laterality: Right

## 2017-05-18 MED ORDER — CLINDAMYCIN PHOSPHATE 900 MG/50ML IV SOLN
900.0000 mg | INTRAVENOUS | Status: AC
  Administered 2017-05-18: 900 mg via INTRAVENOUS

## 2017-05-18 MED ORDER — PROPOFOL 10 MG/ML IV BOLUS
INTRAVENOUS | Status: AC
  Filled 2017-05-18: qty 20

## 2017-05-18 MED ORDER — ONDANSETRON HCL 4 MG/2ML IJ SOLN
INTRAMUSCULAR | Status: DC | PRN
  Administered 2017-05-18: 4 mg via INTRAVENOUS

## 2017-05-18 MED ORDER — OXYCODONE HCL 5 MG PO TABS: 5.0000 mg | ORAL_TABLET | Freq: Once | ORAL | Status: DC | PRN

## 2017-05-18 MED ORDER — BUPIVACAINE HCL (PF) 0.25 % IJ SOLN
INTRAMUSCULAR | Status: AC
  Filled 2017-05-18: qty 60

## 2017-05-18 MED ORDER — FENTANYL CITRATE (PF) 100 MCG/2ML IJ SOLN
50.0000 ug | INTRAMUSCULAR | Status: DC | PRN
  Administered 2017-05-18: 100 ug via INTRAVENOUS

## 2017-05-18 MED ORDER — MIDAZOLAM HCL 2 MG/2ML IJ SOLN
INTRAMUSCULAR | Status: AC
  Filled 2017-05-18: qty 2

## 2017-05-18 MED ORDER — ONDANSETRON HCL 4 MG PO TABS: 4.0000 mg | ORAL_TABLET | Freq: Three times a day (TID) | ORAL | 0 refills | Status: DC | PRN

## 2017-05-18 MED ORDER — OXYCODONE-ACETAMINOPHEN 5-325 MG PO TABS: 1.0000 | ORAL_TABLET | ORAL | 0 refills | Status: DC | PRN

## 2017-05-18 MED ORDER — MIDAZOLAM HCL 2 MG/2ML IJ SOLN
1.0000 mg | INTRAMUSCULAR | Status: DC | PRN
  Administered 2017-05-18 (×2): 1 mg via INTRAVENOUS

## 2017-05-18 MED ORDER — PROPOFOL 10 MG/ML IV BOLUS
INTRAVENOUS | Status: DC | PRN
  Administered 2017-05-18: 200 mg via INTRAVENOUS

## 2017-05-18 MED ORDER — LIDOCAINE 2% (20 MG/ML) 5 ML SYRINGE
INTRAMUSCULAR | Status: AC
  Filled 2017-05-18: qty 5

## 2017-05-18 MED ORDER — DEXAMETHASONE SODIUM PHOSPHATE 10 MG/ML IJ SOLN
INTRAMUSCULAR | Status: DC | PRN
  Administered 2017-05-18: 10 mg via INTRAVENOUS

## 2017-05-18 MED ORDER — LIDOCAINE 2% (20 MG/ML) 5 ML SYRINGE
INTRAMUSCULAR | Status: DC | PRN
  Administered 2017-05-18: 100 mg via INTRAVENOUS

## 2017-05-18 MED ORDER — ONDANSETRON HCL 4 MG/2ML IJ SOLN
INTRAMUSCULAR | Status: AC
  Filled 2017-05-18: qty 2

## 2017-05-18 MED ORDER — SCOPOLAMINE 1 MG/3DAYS TD PT72
MEDICATED_PATCH | TRANSDERMAL | Status: AC
  Filled 2017-05-18: qty 1

## 2017-05-18 MED ORDER — HYDROMORPHONE HCL 1 MG/ML IJ SOLN: 0.2500 mg | INTRAMUSCULAR | Status: DC | PRN

## 2017-05-18 MED ORDER — PROMETHAZINE HCL 25 MG PO TABS: 25.0000 mg | ORAL_TABLET | Freq: Four times a day (QID) | ORAL | 1 refills | Status: DC | PRN

## 2017-05-18 MED ORDER — FENTANYL CITRATE (PF) 100 MCG/2ML IJ SOLN
INTRAMUSCULAR | Status: AC
  Filled 2017-05-18: qty 2

## 2017-05-18 MED ORDER — SCOPOLAMINE 1 MG/3DAYS TD PT72
1.0000 | MEDICATED_PATCH | Freq: Once | TRANSDERMAL | Status: DC | PRN
  Administered 2017-05-18: 1.5 mg via TRANSDERMAL

## 2017-05-18 MED ORDER — CLINDAMYCIN PHOSPHATE 900 MG/50ML IV SOLN
INTRAVENOUS | Status: AC
  Filled 2017-05-18: qty 50

## 2017-05-18 MED ORDER — TIZANIDINE HCL 4 MG PO TABS: 4.0000 mg | ORAL_TABLET | Freq: Four times a day (QID) | ORAL | 2 refills | Status: DC | PRN

## 2017-05-18 MED ORDER — DEXAMETHASONE SODIUM PHOSPHATE 10 MG/ML IJ SOLN
INTRAMUSCULAR | Status: AC
  Filled 2017-05-18: qty 1

## 2017-05-18 MED ORDER — OXYCODONE HCL 5 MG/5ML PO SOLN: 5.0000 mg | Freq: Once | ORAL | Status: DC | PRN

## 2017-05-18 MED ORDER — LACTATED RINGERS IV SOLN
INTRAVENOUS | Status: DC
  Administered 2017-05-18 (×2): via INTRAVENOUS

## 2017-05-18 SURGICAL SUPPLY — 72 items
ADH SKN CLS APL DERMABOND .7 (GAUZE/BANDAGES/DRESSINGS)
BANDAGE ACE 6X5 VEL STRL LF (GAUZE/BANDAGES/DRESSINGS) ×3 IMPLANT
BLADE SURG 15 STRL LF DISP TIS (BLADE) ×2 IMPLANT
BLADE SURG 15 STRL SS (BLADE) ×6
BNDG CMPR 9X4 STRL LF SNTH (GAUZE/BANDAGES/DRESSINGS) ×1
BNDG ESMARK 4X9 LF (GAUZE/BANDAGES/DRESSINGS) ×3 IMPLANT
CORD BIPOLAR FORCEPS 12FT (ELECTRODE) ×3 IMPLANT
COVER BACK TABLE 60X90IN (DRAPES) ×3 IMPLANT
CUFF TOURN SGL LL 18 NRW (TOURNIQUET CUFF) ×3 IMPLANT
CUFF TOURNIQUET SINGLE 18IN (TOURNIQUET CUFF) IMPLANT
CUFF TOURNIQUET SINGLE 24IN (TOURNIQUET CUFF) IMPLANT
DECANTER SPIKE VIAL GLASS SM (MISCELLANEOUS) IMPLANT
DERMABOND ADVANCED (GAUZE/BANDAGES/DRESSINGS)
DERMABOND ADVANCED .7 DNX12 (GAUZE/BANDAGES/DRESSINGS) IMPLANT
DRAPE C-ARM 42X72 X-RAY (DRAPES) ×3 IMPLANT
DRAPE EXTREMITY T 121X128X90 (DRAPE) ×3 IMPLANT
DRAPE IMP U-DRAPE 54X76 (DRAPES) ×3 IMPLANT
DRAPE SURG 17X23 STRL (DRAPES) ×3 IMPLANT
DURAPREP 26ML APPLICATOR (WOUND CARE) ×3 IMPLANT
EXPRESSBRAID GRAFT MANIPULATION ×2 IMPLANT
GAUZE SPONGE 4X4 12PLY STRL (GAUZE/BANDAGES/DRESSINGS) ×3 IMPLANT
GLOVE BIO SURGEON STRL SZ 6.5 (GLOVE) ×2 IMPLANT
GLOVE BIO SURGEONS STRL SZ 6.5 (GLOVE) ×1
GLOVE BIOGEL PI IND STRL 7.0 (GLOVE) IMPLANT
GLOVE BIOGEL PI INDICATOR 7.0 (GLOVE) ×2
GLOVE SKINSENSE NS SZ7.5 (GLOVE) ×2
GLOVE SKINSENSE STRL SZ7.5 (GLOVE) ×1 IMPLANT
GLOVE SURG SYN 7.5  E (GLOVE) ×2
GLOVE SURG SYN 7.5 E (GLOVE) ×1 IMPLANT
GLOVE SURG SYN 7.5 PF PI (GLOVE) ×1 IMPLANT
GOWN STRL REIN XL XLG (GOWN DISPOSABLE) ×3 IMPLANT
GOWN STRL REUS W/ TWL LRG LVL3 (GOWN DISPOSABLE) ×1 IMPLANT
GOWN STRL REUS W/TWL LRG LVL3 (GOWN DISPOSABLE) ×3
IMPL TOGGLELOC 2.9MM (Anchor) IMPLANT
IMPLANT TOGGLELOC 2.9MM (Anchor) ×3 IMPLANT
INSERTER BUTTON (SYSTAGENIX WOUND MANAGEMENT) IMPLANT
JUGGERLOC 7MM REAMER ×3 IMPLANT
KIT CANULA JUGGERLOC (CANNULA) ×2 IMPLANT
NDL PRECISIONGLIDE 27X1.5 (NEEDLE) IMPLANT
NDL SUT 6 .5 CRC .975X.05 MAYO (NEEDLE) ×1 IMPLANT
NEEDLE MAYO TAPER (NEEDLE) ×3
NEEDLE PRECISIONGLIDE 27X1.5 (NEEDLE) IMPLANT
NS IRRIG 1000ML POUR BTL (IV SOLUTION) ×3 IMPLANT
PACK BASIN DAY SURGERY FS (CUSTOM PROCEDURE TRAY) ×3 IMPLANT
PAD CAST 4YDX4 CTTN HI CHSV (CAST SUPPLIES) ×2 IMPLANT
PADDING CAST COTTON 4X4 STRL (CAST SUPPLIES) ×6
PADDING CAST SYNTHETIC 3 NS LF (CAST SUPPLIES) ×2
PADDING CAST SYNTHETIC 3X4 NS (CAST SUPPLIES) IMPLANT
PADDING CAST SYNTHETIC 4 (CAST SUPPLIES) ×2
PADDING CAST SYNTHETIC 4X4 STR (CAST SUPPLIES) IMPLANT
PIN DRILL ACL TIGHTROPE 4MM (PIN) IMPLANT
SLEEVE SCD COMPRESS KNEE MED (MISCELLANEOUS) ×3 IMPLANT
SPLINT FIBERGLASS 3X35 (CAST SUPPLIES) ×34 IMPLANT
SPLINT FIBERGLASS 4X30 (CAST SUPPLIES) ×3 IMPLANT
SPONGE LAP 18X18 X RAY DECT (DISPOSABLE) ×3 IMPLANT
STOCKINETTE 4X48 STRL (DRAPES) ×3 IMPLANT
SUT 2 FIBERLOOP 20 STRT BLUE (SUTURE)
SUT ETHILON 3 0 PS 1 (SUTURE) ×2 IMPLANT
SUT FIBERWIRE #2 38 T-5 BLUE (SUTURE)
SUT MNCRL AB 4-0 PS2 18 (SUTURE) IMPLANT
SUT SILK 0 TIES 10X30 (SUTURE) IMPLANT
SUT VIC AB 2-0 SH 27 (SUTURE) ×3
SUT VIC AB 2-0 SH 27XBRD (SUTURE) IMPLANT
SUT VIC AB 3-0 PS1 18 (SUTURE)
SUT VIC AB 3-0 PS1 18XBRD (SUTURE) IMPLANT
SUTURE 2 FIBERLOOP 20 STRT BLU (SUTURE) IMPLANT
SUTURE FIBERWR #2 38 T-5 BLUE (SUTURE) IMPLANT
SYR BULB 3OZ (MISCELLANEOUS) ×3 IMPLANT
SYR CONTROL 10ML LL (SYRINGE) IMPLANT
TOWEL OR 17X24 6PK STRL BLUE (TOWEL DISPOSABLE) ×6 IMPLANT
TOWEL OR NON WOVEN STRL DISP B (DISPOSABLE) ×3 IMPLANT
UNDERPAD 30X30 (UNDERPADS AND DIAPERS) ×3 IMPLANT

## 2017-05-18 NOTE — Progress Notes (Signed)
Pt with continued nausea, Dr. Jacklynn BueMassagee notified, no new orders received, will allow patient time to rest and recheck in a few minutes

## 2017-05-18 NOTE — Progress Notes (Signed)
Assisted Dr. Massagee with right, ultrasound guided, supraclavicular block. Side rails up, monitors on throughout procedure. See vital signs in flow sheet. Tolerated Procedure well. 

## 2017-05-18 NOTE — Anesthesia Postprocedure Evaluation (Signed)
Anesthesia Post Note  Patient: Rico Alahillip W Basista  Procedure(s) Performed: RIGHT DISTAL BICEPS REPAIR (Right Arm Upper)     Patient location during evaluation: PACU Anesthesia Type: Regional and General Level of consciousness: awake and alert Pain management: pain level controlled Vital Signs Assessment: post-procedure vital signs reviewed and stable Respiratory status: spontaneous breathing, nonlabored ventilation, respiratory function stable and patient connected to nasal cannula oxygen Cardiovascular status: blood pressure returned to baseline and stable Postop Assessment: no apparent nausea or vomiting Anesthetic complications: no    Last Vitals:  Vitals:   05/18/17 1213 05/18/17 1300  BP: 118/90   Pulse: 80 82  Resp: 18 20  Temp: (!) 36.4 C   SpO2: 97% 98%    Last Pain:  Vitals:   05/18/17 1300  TempSrc:   PainSc: 0-No pain                 Odena Mcquaid,JAMES TERRILL

## 2017-05-18 NOTE — Anesthesia Procedure Notes (Addendum)
Anesthesia Regional Block: Supraclavicular block   Pre-Anesthetic Checklist: ,, timeout performed, Correct Patient, Correct Site, Correct Laterality, Correct Procedure, Correct Position, site marked, Risks and benefits discussed,  Surgical consent,  Pre-op evaluation,  At surgeon's request and post-op pain management  Laterality: Right and Upper  Prep: chloraprep       Needles:   Needle Type: Echogenic Stimulator Needle     Needle Length: 9cm  Needle Gauge: 21   Needle insertion depth: 4 cm   Additional Needles:   Procedures:,,,, ultrasound used (permanent image in chart),,,,  Narrative:  Start time: 05/18/2017 9:10 AM End time: 05/18/2017 9:25 AM Injection made incrementally with aspirations every 5 mL.  Performed by: Personally  Anesthesiologist: Sharee HolsterMassagee, Richanda Darin, MD

## 2017-05-18 NOTE — Op Note (Signed)
   Date of Surgery: 05/18/2017  INDICATIONS: [name] is a 41 y.o.-year-old male with a right distal biceps rupture;  The patient did consent to the procedure after discussion of the risks and benefits.  PREOPERATIVE DIAGNOSIS: Right distal biceps rupture  POSTOPERATIVE DIAGNOSIS: Same.  PROCEDURE: Reattachment of right distal biceps rupture  SURGEON: N. Glee ArvinMichael Earlee Herald, M.D.  ASSIST: None.  ANESTHESIA:  general, regional  IV FLUIDS AND URINE: See anesthesia.  ESTIMATED BLOOD LOSS: Minimal mL.  IMPLANTS: Zimmer Biomet toggle lock 2.9 mm  DRAINS: None  COMPLICATIONS: None.  DESCRIPTION OF PROCEDURE: The patient was brought to the operating room and placed supine on the operating table.  The patient had been signed prior to the procedure and this was documented. The patient had the anesthesia placed by the anesthesiologist.  A time-out was performed to confirm that this was the correct patient, site, side and location. The patient did receive antibiotics prior to the incision and was re-dosed during the procedure as needed at indicated intervals.  A tourniquet was placed.  The patient had the operative extremity prepped and draped in the standard surgical fashion.    A longitudinal incision was made between the interval of brachial radialis and pronator teres.  Blunt dissection was carried down to the superficial veins.  Small branches of the cephalic vein were ligated and the main vein was mobilized and retracted.  Dissection was continued down through the muscular interval to the radial tuberosity.  Perforating veins were cauterized with bipolar.  The distal biceps stump was then identified using blunt dissection.  The tendon was completely ruptured and retracted.  The tendon stump was then sharply incised in order to bring up healthy tissue for direct repair.  The distal biceps tendon was then whipstitched.  Using fluoroscopic guidance and with the forearm in full supination we then placed  a guidepin through the radial tuberosity bicortically.  A 7 mm reamer was then advanced over the guidepin through the near cortex taking care not to perforate the far cortex.  The guide pin was then removed and the toggle lock was then advanced through the cannulated portion of the reamer through both cortices.  The flip button was then flipped.  This was confirmed under fluoroscopy.  This had excellent fixation.  We then sutured the whipstitch to the toggle lock sutures in order to advance this down into the cortical hole.  The tendon was advanced down into the hole with the help of the sutures and a Freer.  Just for extra fixation the sutures were then advanced up the tendon in a Krakw fashion.  This was then tied off.  Elbow range of motion was then performed and showed good fixation of the distal biceps as one unit.  The wound was then thoroughly irrigated with 3 L of normal saline.  Hemostasis was obtained.  Surgical wound was closed in a layered fashion using 2-0 Vicryl and 3-0 nylon.  Sterile dressings were applied.  Patient tolerated procedure well had no immediate complications.  A long-arm splint was placed with the elbow at 90 degrees.  POSTOPERATIVE PLAN: Patient will be discharged home and follow-up in the office in 1 week to initiate elbow range of motion.  [name]. [name], MD [facility] [phone] 11:10 AM

## 2017-05-18 NOTE — Transfer of Care (Signed)
Immediate Anesthesia Transfer of Care Note  Patient: Jerry Owens  Procedure(s) Performed: RIGHT DISTAL BICEPS REPAIR (Right Arm Upper)  Patient Location: PACU  Anesthesia Type:GA combined with regional for post-op pain  Level of Consciousness: awake, alert  and oriented  Airway & Oxygen Therapy: Patient Spontanous Breathing and Patient connected to face mask oxygen  Post-op Assessment: Report given to RN and Post -op Vital signs reviewed and stable  Post vital signs: Reviewed and stable  Last Vitals:  Vitals:   05/18/17 0930 05/18/17 1119  BP: 123/65 (!) 141/58  Pulse: 83   Resp: 12   Temp:    SpO2: 99%     Last Pain:  Vitals:   05/18/17 0829  TempSrc: Oral  PainSc:       Patients Stated Pain Goal: 5 (05/18/17 0828)  Complications: No apparent anesthesia complications

## 2017-05-18 NOTE — Anesthesia Procedure Notes (Signed)
Procedure Name: LMA Insertion Date/Time: 05/18/2017 9:45 AM Performed by: Pearson Grippeobertson, Angeleen Horney M, CRNA Pre-anesthesia Checklist: Patient identified, Emergency Drugs available, Suction available and Patient being monitored Patient Re-evaluated:Patient Re-evaluated prior to induction Oxygen Delivery Method: Circle system utilized Preoxygenation: Pre-oxygenation with 100% oxygen Induction Type: IV induction Ventilation: Mask ventilation without difficulty LMA: LMA inserted LMA Size: 5.0 Number of attempts: 1 Airway Equipment and Method: Bite block Placement Confirmation: positive ETCO2 Tube secured with: Tape Dental Injury: Teeth and Oropharynx as per pre-operative assessment

## 2017-05-18 NOTE — Discharge Instructions (Signed)
Post Anesthesia Home Care Instructions  Activity: Get plenty of rest for the remainder of the day. A responsible individual must stay with you for 24 hours following the procedure.  For the next 24 hours, DO NOT: -Drive a car -Advertising copywriterperate machinery -Drink alcoholic beverages -Take any medication unless instructed by your physician -Make any legal decisions or sign important papers.  Meals: Start with liquid foods such as gelatin or soup. Progress to regular foods as tolerated. Avoid greasy, spicy, heavy foods. If nausea and/or vomiting occur, drink only clear liquids until the nausea and/or vomiting subsides. Call your physician if vomiting continues.  Special Instructions/Symptoms: Your throat may feel dry or sore from the anesthesia or the breathing tube placed in your throat during surgery. If this causes discomfort, gargle with warm salt water. The discomfort should disappear within 24 hours.  If you had a scopolamine patch placed behind your ear for the management of post- operative nausea and/or vomiting:  1. The medication in the patch is effective for 72 hours, after which it should be removed.  Wrap patch in a tissue and discard in the trash. Wash hands thoroughly with soap and water. 2. You may remove the patch earlier than 72 hours if you experience unpleasant side effects which may include dry mouth, dizziness or visual disturbances. 3. Avoid touching the patch. Wash your hands with soap and water after contact with the patch.   Postoperative instructions:  Weightbearing instructions: non weight bearing  Dressing instructions: Keep your dressing and/or splint clean and dry at all times.  It will be removed at your first post-operative appointment.  Your stitches and/or staples will be removed at this visit.  Incision instructions:  Do not soak your incision for 3 weeks after surgery.  If the incision gets wet, pat dry and do not scrub the incision.  Pain control:  You have  been given a prescription to be taken as directed for post-operative pain control.  In addition, elevate the operative extremity above the heart at all times to prevent swelling and throbbing pain.  Take over-the-counter Colace, 100mg  by mouth twice a day while taking narcotic pain medications to help prevent constipation.  Follow up appointments: 1) 10-14 days for suture removal and wound check. 2) Dr. Roda ShuttersXu as scheduled.   -------------------------------------------------------------------------------------------------------------  After Surgery Pain Control:  After your surgery, post-surgical discomfort or pain is likely. This discomfort can last several days to a few weeks. At certain times of the day your discomfort may be more intense.  Did you receive a nerve block?  A nerve block can provide pain relief for one hour to two days after your surgery. As long as the nerve block is working, you will experience little or no sensation in the area the surgeon operated on.  As the nerve block wears off, you will begin to experience pain or discomfort. It is very important that you begin taking your prescribed pain medication before the nerve block fully wears off. Treating your pain at the first sign of the block wearing off will ensure your pain is better controlled and more tolerable when full-sensation returns. Do not wait until the pain is intolerable, as the medicine will be less effective. It is better to treat pain in advance than to try and catch up.  General Anesthesia:  If you did not receive a nerve block during your surgery, you will need to start taking your pain medication shortly after your surgery and should continue to do so as  prescribed by your surgeon.  Pain Medication:  Most commonly we prescribe Vicodin and Percocet for post-operative pain. Both of these medications contain a combination of acetaminophen (Tylenol) and a narcotic to help control pain.   It takes between 30 and  45 minutes before pain medication starts to work. It is important to take your medication before your pain level gets too intense.   Nausea is a common side effect of many pain medications. You will want to eat something before taking your pain medicine to help prevent nausea.   If you are taking a prescription pain medication that contains acetaminophen, we recommend that you do not take additional over the counter acetaminophen (Tylenol).  Other pain relieving options:   Using a cold pack to ice the affected area a few times a day (15 to 20 minutes at a time) can help to relieve pain, reduce swelling and bruising.   Elevation of the affected area can also help to reduce pain and swelling.      Post Anesthesia Home Care Instructions  Activity: Get plenty of rest for the remainder of the day. A responsible individual must stay with you for 24 hours following the procedure.  For the next 24 hours, DO NOT: -Drive a car -Advertising copywriterperate machinery -Drink alcoholic beverages -Take any medication unless instructed by your physician -Make any legal decisions or sign important papers.  Meals: Start with liquid foods such as gelatin or soup. Progress to regular foods as tolerated. Avoid greasy, spicy, heavy foods. If nausea and/or vomiting occur, drink only clear liquids until the nausea and/or vomiting subsides. Call your physician if vomiting continues.  Special Instructions/Symptoms: Your throat may feel dry or sore from the anesthesia or the breathing tube placed in your throat during surgery. If this causes discomfort, gargle with warm salt water. The discomfort should disappear within 24 hours.  If you had a scopolamine patch placed behind your ear for the management of post- operative nausea and/or vomiting:  1. The medication in the patch is effective for 72 hours, after which it should be removed.  Wrap patch in a tissue and discard in the trash. Wash hands thoroughly with soap and  water. 2. You may remove the patch earlier than 72 hours if you experience unpleasant side effects which may include dry mouth, dizziness or visual disturbances. 3. Avoid touching the patch. Wash your hands with soap and water after contact with the patch.  Regional Anesthesia Blocks  1. Numbness or the inability to move the "blocked" extremity may last from 3-48 hours after placement. The length of time depends on the medication injected and your individual response to the medication. If the numbness is not going away after 48 hours, call your surgeon.  2. The extremity that is blocked will need to be protected until the numbness is gone and the  Strength has returned. Because you cannot feel it, you will need to take extra care to avoid injury. Because it may be weak, you may have difficulty moving it or using it. You may not know what position it is in without looking at it while the block is in effect.  3. For blocks in the legs and feet, returning to weight bearing and walking needs to be done carefully. You will need to wait until the numbness is entirely gone and the strength has returned. You should be able to move your leg and foot normally before you try and bear weight or walk. You will need someone to  be with you when you first try to ensure you do not fall and possibly risk injury.  4. Bruising and tenderness at the needle site are common side effects and will resolve in a few days.  5. Persistent numbness or new problems with movement should be communicated to the surgeon or the [facility] [phone] [facility] [phone]).

## 2017-05-18 NOTE — Anesthesia Preprocedure Evaluation (Signed)
Anesthesia Evaluation  Patient identified by MRN, date of birth, ID band Patient awake    Reviewed: Allergy & Precautions, NPO status , Patient's Chart, lab work & pertinent test results  History of Anesthesia Complications (+) PONV and history of anesthetic complications  Airway Mallampati: II   Neck ROM: Full    Dental   Pulmonary neg pulmonary ROS, former smoker,    breath sounds clear to auscultation       Cardiovascular negative cardio ROS   Rhythm:Regular Rate:Normal     Neuro/Psych negative neurological ROS  negative psych ROS   GI/Hepatic negative GI ROS, Neg liver ROS,   Endo/Other  negative endocrine ROS  Renal/GU negative Renal ROS  negative genitourinary   Musculoskeletal negative musculoskeletal ROS (+) Ruptured tendon   Abdominal   Peds negative pediatric ROS (+)  Hematology negative hematology ROS (+)   Anesthesia Other Findings   Reproductive/Obstetrics negative OB ROS                             Anesthesia Physical Anesthesia Plan  ASA: II  Anesthesia Plan: General and Regional   Post-op Pain Management:  Regional for Post-op pain   Induction: Intravenous  PONV Risk Score and Plan: 4 or greater and Treatment may vary due to age or medical condition, Ondansetron, Dexamethasone, Scopolamine patch - Pre-op and Midazolam  Airway Management Planned: LMA  Additional Equipment:   Intra-op Plan:   Post-operative Plan: Extubation in OR  Informed Consent: I have reviewed the patients History and Physical, chart, labs and discussed the procedure including the risks, benefits and alternatives for the proposed anesthesia with the patient or authorized representative who has indicated his/her understanding and acceptance.     Plan Discussed with: CRNA  Anesthesia Plan Comments:         Anesthesia Quick Evaluation

## 2017-05-18 NOTE — H&P (Signed)
    PREOPERATIVE H&P  Chief Complaint: right distal biceps rupture  HPI: Jerry Owens is a 41 y.o. male who presents for surgical treatment of right distal biceps rupture.  He denies any changes in medical history.  Past Medical History:  Diagnosis Date  . Complication of anesthesia    hard to wake up  . Hypothyroidism   . PONV (postoperative nausea and vomiting)   . Rupture of right distal biceps tendon    Past Surgical History:  Procedure Laterality Date  . SHOULDER SURGERY     left rotator cuff surgery   Social History   Socioeconomic History  . Marital status: Married    Spouse name: None  . Number of children: None  . Years of education: None  . Highest education level: None  Social Needs  . Financial resource strain: None  . Food insecurity - worry: None  . Food insecurity - inability: None  . Transportation needs - medical: None  . Transportation needs - non-medical: None  Occupational History  . None  Tobacco Use  . Smoking status: Former Smoker    Types: Cigarettes    Last attempt to quit: 11/21/2004    Years since quitting: 12.4  . Smokeless tobacco: Never Used  Substance and Sexual Activity  . Alcohol use: Yes    Comment: social  . Drug use: No  . Sexual activity: Yes  Other Topics Concern  . None  Social History Narrative  . None   History reviewed. No pertinent family history. Allergies  Allergen Reactions  . Amoxicillin     Swelling in face, neck, ears, and chest  . Penicillins    Prior to Admission medications   Medication Sig Start Date End Date Taking? Authorizing Provider  cetirizine-pseudoephedrine (ZYRTEC-D) 5-120 MG tablet Take 1 tablet 2 (two) times daily by mouth.   Yes [provider]  finasteride (PROSCAR) 5 MG tablet Take 5 mg by mouth daily.   Yes [provider]  levothyroxine (SYNTHROID, LEVOTHROID) 88 MCG tablet Take 88 mcg by mouth daily before breakfast.   Yes [provider]  traMADol  (ULTRAM) 50 MG tablet Take 1-2 tablets (50-100 mg total) 3 (three) times daily as needed by mouth. 05/12/17  Yes Tarry KosXu, Doss Cybulski M, MD  vitamin C (ASCORBIC ACID) 500 MG tablet Take 500 mg daily by mouth.   Yes [provider]     Positive ROS: All other systems have been reviewed and were otherwise negative with the exception of those mentioned in the HPI and as above.  Physical Exam: General: Alert, no acute distress Cardiovascular: No pedal edema Respiratory: No cyanosis, no use of accessory musculature GI: abdomen soft Skin: No lesions in the area of chief complaint Neurologic: Sensation intact distally Psychiatric: Patient is competent for consent with normal mood and affect Lymphatic: no lymphedema  MUSCULOSKELETAL: exam stable  Assessment: right distal biceps rupture  Plan: Plan for Procedure(s): RIGHT DISTAL BICEPS REPAIR  The risks benefits and alternatives were discussed with the patient including but not limited to the risks of nonoperative treatment, versus surgical intervention including infection, bleeding, nerve injury,  blood clots, cardiopulmonary complications, morbidity, mortality, among others, and they were willing to proceed.   Glee ArvinMichael Haliyah Fryman, MD   05/18/2017 8:24 AM

## 2017-05-19 ENCOUNTER — Encounter (HOSPITAL_BASED_OUTPATIENT_CLINIC_OR_DEPARTMENT_OTHER): Payer: Self-pay | Admitting: Orthopaedic Surgery

## 2017-05-19 ENCOUNTER — Telehealth (INDEPENDENT_AMBULATORY_CARE_PROVIDER_SITE_OTHER): Payer: Self-pay | Admitting: Orthopaedic Surgery

## 2017-05-19 NOTE — Telephone Encounter (Signed)
Called [facility] back advised they can fill percocet

## 2017-05-19 NOTE — Telephone Encounter (Signed)
Pharmacy call in regards to patients RX (Percocets) CB # [phone]

## 2017-05-20 ENCOUNTER — Telehealth (INDEPENDENT_AMBULATORY_CARE_PROVIDER_SITE_OTHER): Payer: Self-pay | Admitting: Orthopaedic Surgery

## 2017-05-20 NOTE — Telephone Encounter (Signed)
Marene LenzKyle Smith with [employer] called needing to know if patient had bicep tendon repair on 05/18/17. The number to contact Ronaldo MiyamotoKyle is [phone]

## 2017-05-23 NOTE — Telephone Encounter (Signed)
See message.

## 2017-05-23 NOTE — Telephone Encounter (Signed)
yes

## 2017-05-24 ENCOUNTER — Ambulatory Visit (INDEPENDENT_AMBULATORY_CARE_PROVIDER_SITE_OTHER): Payer: Managed Care, Other (non HMO) | Admitting: Orthopaedic Surgery

## 2017-05-24 DIAGNOSIS — S46211D Strain of muscle, fascia and tendon of other parts of biceps, right arm, subsequent encounter: Secondary | ICD-10-CM

## 2017-05-24 NOTE — Progress Notes (Signed)
Patient is one-week status post right distal biceps repair.  He is doing well overall.  He does complain of some numbness and tingling in the forearm region.  Incision is clean dry and intact without signs of infection.  Numbness in the LABC distribution.  No focal motor deficits.  At this point discontinue splint and begin gentle range of motion with physical therapy.  Continue nonweightbearing.  Follow-up next week for suture removal.

## 2017-05-25 NOTE — Telephone Encounter (Signed)
Called no answer

## 2017-05-31 ENCOUNTER — Encounter (INDEPENDENT_AMBULATORY_CARE_PROVIDER_SITE_OTHER): Payer: Self-pay | Admitting: Orthopaedic Surgery

## 2017-05-31 ENCOUNTER — Ambulatory Visit (INDEPENDENT_AMBULATORY_CARE_PROVIDER_SITE_OTHER): Payer: Managed Care, Other (non HMO) | Admitting: Orthopaedic Surgery

## 2017-05-31 DIAGNOSIS — S46211D Strain of muscle, fascia and tendon of other parts of biceps, right arm, subsequent encounter: Secondary | ICD-10-CM

## 2017-05-31 NOTE — Progress Notes (Signed)
Patient is two-week status post right distal biceps repair.  He continues to complain of some numbness in his thumb and index finger.  He takes an occasional oxycodone at night for pain.  Overall he is doing well.  He still complains of swelling.  Physical exam shows a healed incision without signs of infection.  Swelling and bruising are both present.  He can flex his index finger DIP joint but he is unable to flex the thumb IP joint.  He has numbness in his thumb and index finger.  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  We will continue to watch this for improvement.  Reevaluation and 4 weeks.  Patient is to continue physical therapy for joint mobilization.  We will consider EMG if AIN palsy does not resolve by the next visit.

## 2017-06-02 ENCOUNTER — Telehealth (INDEPENDENT_AMBULATORY_CARE_PROVIDER_SITE_OTHER): Payer: Self-pay | Admitting: Orthopaedic Surgery

## 2017-06-02 NOTE — Telephone Encounter (Signed)
See message.

## 2017-06-02 NOTE — Telephone Encounter (Signed)
Angert,Mychal  1975/08/01   Physical Therapy & hand specialist Phone number [phone] Fax number [phone]   Physical Therapist needs to know the protocol for pt follow up care.

## 2017-06-02 NOTE — Telephone Encounter (Signed)
PROM, AAROM, may perform shoulder and wrist ROM also.  May not lift anything yet

## 2017-06-02 NOTE — Telephone Encounter (Signed)
Faxed protocol for pt follow up care

## 2017-06-07 ENCOUNTER — Telehealth (INDEPENDENT_AMBULATORY_CARE_PROVIDER_SITE_OTHER): Payer: Self-pay | Admitting: Orthopaedic Surgery

## 2017-06-07 NOTE — Telephone Encounter (Signed)
Patient came in with a letter from short term disability company saying that it has been termed on 06/30/17. Patient had surgery on 11/14 and doesn't believe this is correct. A note will need to be faxed to [facility] [employer] [phone]. I also left a copy in Dr. Warren DanesXu's box with the detailed requirements that need to be sent in for an extension. Patients CB # [phone]

## 2017-06-08 ENCOUNTER — Encounter (INDEPENDENT_AMBULATORY_CARE_PROVIDER_SITE_OTHER): Payer: Self-pay

## 2017-06-08 NOTE — Telephone Encounter (Signed)
Made a letter for patient. Called patient. Advised him I will fax letter to disability people.  Fax number [phone]

## 2017-06-09 NOTE — Telephone Encounter (Signed)
faxed

## 2017-06-09 NOTE — Telephone Encounter (Signed)
Please fax protocol again to PT & hand specialist. It was sent to their phone number I believe. Fax # [phone]

## 2017-06-30 ENCOUNTER — Encounter (INDEPENDENT_AMBULATORY_CARE_PROVIDER_SITE_OTHER): Payer: Self-pay | Admitting: Orthopaedic Surgery

## 2017-06-30 ENCOUNTER — Ambulatory Visit (INDEPENDENT_AMBULATORY_CARE_PROVIDER_SITE_OTHER): Payer: Managed Care, Other (non HMO) | Admitting: Orthopaedic Surgery

## 2017-06-30 DIAGNOSIS — S46211D Strain of muscle, fascia and tendon of other parts of biceps, right arm, subsequent encounter: Secondary | ICD-10-CM

## 2017-06-30 NOTE — Progress Notes (Signed)
Office Visit Note   Patient: Jerry Owens           Date of Birth: 09/27/1975           MRN: [id number] Visit Date: 06/30/2017              Requested by: Renford DillsPolite, Ronald, MD [address] [place], [place] [address] PCP: Renford DillsPolite, Ronald, MD   Assessment & Plan: Visit Diagnoses:  1. Rupture of right distal biceps tendon, subsequent encounter     Plan: Patient is progressing appropriately although he is not ready for his regular duty.  He may return to work as long as he does not lift more than 10 pounds or have to use his right arm in a repetitive fashion.  Hopefully his employer can find modified duty for him.  At this point he may progress with physical therapy to weightbearing strengthening exercises.  I will see him back in 6 weeks for recheck.  Follow-Up Instructions: Return in about 6 weeks (around 08/11/2017).   Orders:  No orders of the defined types were placed in this encounter.  No orders of the defined types were placed in this encounter.     Procedures: No procedures performed   Clinical Data: No additional findings.   Subjective: Chief Complaint  Patient presents with  . Right Elbow - Routine Post Op    HPI Patient is 6 weeks status post right distal biceps repair.  He is improving slowly.  He is not taking any medicines.  He has been doing nonweightbearing physical therapy and occupational therapy.  He is firing his FPL and FDP index at this point.  He continues to complain of soreness and weakness especially with the use of the arm and a weak grip. Review of Systems   Objective: Vital Signs: There were no vitals taken for this visit.  Physical Exam  Ortho Exam Surgical scar is fully healed.  Range of motion is improving although not quite 100% normal.  He has numbness in the LABC distribution.  He has weakness of his grip and with elbow flexion as expected.  He is now able to activate his FPL and FDP to his index finger.   Specialty  Comments:  No specialty comments available.  Imaging: No results found.   PMFS History: Patient Active Problem List   Diagnosis Date Noted  . Rupture of right distal biceps tendon 05/18/2017  . Biceps tendinitis on right 05/20/2016  . Impingement syndrome of right shoulder 05/20/2016  . Palpitations 11/18/2015  . Confusional state 11/18/2015  . Nonspecific abnormal electrocardiogram (ECG) (EKG) 11/18/2015   Past Medical History:  Diagnosis Date  . Complication of anesthesia    hard to wake up  . Hypothyroidism   . PONV (postoperative nausea and vomiting)   . Rupture of right distal biceps tendon     History reviewed. No pertinent family history.  Past Surgical History:  Procedure Laterality Date  . COLONOSCOPY WITH PROPOFOL N/A 12/22/2015   Procedure: COLONOSCOPY WITH PROPOFOL;  Surgeon: Charolett BumpersMartin K Johnson, MD;  Location: [facility];  Service: Endoscopy;  Laterality: N/A;  . DISTAL BICEPS TENDON REPAIR Right 05/18/2017   Procedure: RIGHT DISTAL BICEPS REPAIR;  Surgeon: Tarry KosXu, Naiping M, MD;  Location: [facility];  Service: Orthopedics;  Laterality: Right;  . SHOULDER SURGERY     left rotator cuff surgery   Social History   Occupational History  . Not on file  Tobacco Use  . Smoking  status: Former Smoker    Types: Cigarettes    Last attempt to quit: 11/21/2004    Years since quitting: 12.6  . Smokeless tobacco: Never Used  Substance and Sexual Activity  . Alcohol use: Yes    Comment: social  . Drug use: No  . Sexual activity: Yes

## 2017-07-28 ENCOUNTER — Ambulatory Visit: Payer: Managed Care, Other (non HMO) | Admitting: Family Medicine

## 2017-07-28 ENCOUNTER — Encounter: Payer: Self-pay | Admitting: Family Medicine

## 2017-07-28 ENCOUNTER — Ambulatory Visit: Payer: 59 | Admitting: Family Medicine

## 2017-07-28 VITALS — BP 134/82 | HR 111 | Temp 98.2°F | Ht 68.0 in | Wt 206.4 lb

## 2017-07-28 DIAGNOSIS — L649 Androgenic alopecia, unspecified: Secondary | ICD-10-CM | POA: Diagnosis not present

## 2017-07-28 DIAGNOSIS — Z7689 Persons encountering health services in other specified circumstances: Secondary | ICD-10-CM | POA: Diagnosis not present

## 2017-07-28 DIAGNOSIS — E039 Hypothyroidism, unspecified: Secondary | ICD-10-CM | POA: Diagnosis not present

## 2017-07-28 NOTE — Assessment & Plan Note (Signed)
Seems to be doing well on current dose of levothyroxine.  Will defer further management to his endocrinologist.

## 2017-07-28 NOTE — Patient Instructions (Signed)
It is very nice to meet you today! We will not make any changes today.  Please come back to see me in a few months for your annual physical.  We can check your cholesterol, diabetes level, blood counts, kidney function, and liver function at that time.  Please let me know if you need any refills.  Please come back to see me sooner as needed.  Take care, Dr. Jimmey RalphParker

## 2017-07-28 NOTE — Progress Notes (Signed)
Subjective:  Jerry Owens is a 42 y.o. male who presents today with a chief complaint of hypothyroidsm and to establish care.   HPI:  Hypothyroidism, New Problem Currently on levothyroxine 112 mcg daily.  He has an endocrinologist who is managing this.  No palpitations.  No weight loss.  Alopecia, New Problem Several year history.  Stable for the past several years.  Currently on finasteride 1.25 mg daily.  Thinks that this has worked well for him.  No obvious side effects.  No decreased libido or erectile dysfunction.  No other treatments tried.  No other obvious alleviating or aggravating factors.  ROS: Per HPI, otherwise a 10 point review of systems was performed and was negative  PMH:  The following were reviewed and entered/updated in epic: Past Medical History:  Diagnosis Date  . Complication of anesthesia    hard to wake up  . Hypothyroidism   . PONV (postoperative nausea and vomiting)   . Rupture of right distal biceps tendon    Patient Active Problem List   Diagnosis Date Noted  . Hypothyroidism 07/28/2017  . 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 Rupture of right distal biceps tendon 05/18/2017  . Impingement syndrome of right shoulder 05/20/2016   Past Surgical History:  Procedure Laterality Date  . COLONOSCOPY WITH PROPOFOL N/A 12/22/2015   Procedure: COLONOSCOPY WITH PROPOFOL;  Surgeon: Charolett Bumpers, MD;  Location: [facility];  Service: Endoscopy;  Laterality: N/A;  . DISTAL BICEPS TENDON REPAIR Right 05/18/2017   Procedure: RIGHT DISTAL BICEPS REPAIR;  Surgeon: Tarry Kos, MD;  Location: [facility];  Service: Orthopedics;  Laterality: Right;  . SHOULDER SURGERY     left rotator cuff surgery   Family history significant for pancreatic cancer in his mother's side of the family.  Medications- reviewed and updated Current Outpatient Medications  Medication Sig Dispense Refill  . Ascorbic Acid (VITAMIN C) 100 MG tablet Take 100 mg by  mouth daily.    . finasteride (PROSCAR) 5 MG tablet Take 1.25 mg by mouth daily.    Marland Kitchen levothyroxine (SYNTHROID, LEVOTHROID) 112 MCG tablet Take 112 mcg by mouth daily before breakfast.     No current facility-administered medications for this visit.    Allergies-reviewed and updated Allergies  Allergen Reactions  . Amoxicillin     Swelling in face, neck, ears, and chest  . Penicillins    Social History   Socioeconomic History  . Marital status: Married    Spouse name: None  . Number of children: 2  . Years of education: None  . Highest education level: None  Social Needs  . Financial resource strain: None  . Food insecurity - worry: None  . Food insecurity - inability: None  . Transportation needs - medical: None  . Transportation needs - non-medical: None  Occupational History  . None  Tobacco Use  . Smoking status: Former Smoker    Types: Cigarettes    Last attempt to quit: 11/21/2004    Years since quitting: 12.6  . Smokeless tobacco: Never Used  Substance and Sexual Activity  . Alcohol use: Yes    Comment: social  . Drug use: No  . Sexual activity: Yes  Other Topics Concern  . None  Social History Narrative  . None   Objective:  Physical Exam: BP 134/82 (BP Location: Left Arm, Patient Position: Sitting, Cuff Size: Normal)   Pulse (!) 111   Temp 98.2 F (36.8 C) (Oral)   Ht 5\' 8"  (  1.727 m)   Wt 206 lb 6.4 oz (93.6 kg)   SpO2 95%   BMI 31.38 kg/m   Gen: NAD, resting comfortably HEENT: Male pattern balding noted on scalp. CV: RRR with no murmurs appreciated Pulm: NWOB, CTAB with no crackles, wheezes, or rhonchi GI: Normal bowel sounds present. Soft, Nontender, Nondistended. MSK: No edema, cyanosis, or clubbing noted Skin: Warm, dry Neuro: Grossly normal, moves all extremities Psych: Normal affect and thought content  Assessment/Plan:  Hypothyroidism Seems to be doing well on current dose of levothyroxine.  Will defer further management to his  endocrinologist.  Androgenic alopecia Stable.  Continue finasteride 1.25 mg daily.  Preventative healthcare Patient will return soon for comprehensive physical exam.  Caleb M. Jimmey RalphParker, MD 07/28/2017 4:14 PM

## 2017-07-28 NOTE — Assessment & Plan Note (Signed)
Stable.  Continue finasteride 1.25 mg daily.

## 2017-08-08 ENCOUNTER — Encounter: Payer: Self-pay | Admitting: Physical Therapy

## 2017-08-12 ENCOUNTER — Ambulatory Visit (INDEPENDENT_AMBULATORY_CARE_PROVIDER_SITE_OTHER): Payer: 59 | Admitting: Orthopaedic Surgery

## 2017-08-12 ENCOUNTER — Encounter (INDEPENDENT_AMBULATORY_CARE_PROVIDER_SITE_OTHER): Payer: Self-pay | Admitting: Orthopaedic Surgery

## 2017-08-12 DIAGNOSIS — S46211D Strain of muscle, fascia and tendon of other parts of biceps, right arm, subsequent encounter: Secondary | ICD-10-CM

## 2017-08-12 NOTE — Progress Notes (Signed)
Patient is almost 90 days status post right distal biceps repair.  He is doing much better.  He still has some occasional soreness and numbness and is currently out of work.  He is doing physical therapy twice a week.  He is also doing home exercise.  His surgical scar is fully healed.  His range of motion is significantly improved but he lacks about 5-10 degrees of full extension.  He does have good supination and pronation.  His AIN nerve function is normal now.  From my standpoint I want him to work on physical therapy for another week with continued strengthening.  I think is fine for him to return to work without restriction on 08/22/2017.  If he has any issues with going back to full duty he knows he can come back to see us am happy to give him any restrictions that I feel is appropriate.  Otherwise follow-up as needed.

## 2017-09-26 ENCOUNTER — Encounter: Payer: Self-pay | Admitting: Family Medicine

## 2017-09-26 ENCOUNTER — Ambulatory Visit: Payer: 59 | Admitting: Family Medicine

## 2017-09-26 VITALS — BP 132/64 | HR 82 | Temp 98.5°F | Wt 205.0 lb

## 2017-09-26 DIAGNOSIS — R05 Cough: Secondary | ICD-10-CM | POA: Diagnosis not present

## 2017-09-26 DIAGNOSIS — R059 Cough, unspecified: Secondary | ICD-10-CM

## 2017-09-26 DIAGNOSIS — J329 Chronic sinusitis, unspecified: Secondary | ICD-10-CM

## 2017-09-26 MED ORDER — DOXYCYCLINE HYCLATE 100 MG PO TABS: 100.0000 mg | ORAL_TABLET | Freq: Two times a day (BID) | ORAL | 0 refills | Status: DC

## 2017-09-26 MED ORDER — METHYLPREDNISOLONE ACETATE 80 MG/ML IJ SUSP: 80.0000 mg | Freq: Once | INTRAMUSCULAR | Status: DC

## 2017-09-26 MED ORDER — GUAIFENESIN-CODEINE 100-10 MG/5ML PO SOLN: 5.0000 mL | Freq: Three times a day (TID) | ORAL | 0 refills | Status: DC | PRN

## 2017-09-26 MED ORDER — IPRATROPIUM BROMIDE 0.06 % NA SOLN: 2.0000 | Freq: Four times a day (QID) | NASAL | 0 refills | Status: DC

## 2017-09-26 MED ORDER — METHYLPREDNISOLONE ACETATE 80 MG/ML IJ SUSP
80.0000 mg | Freq: Once | INTRAMUSCULAR | Status: AC
  Administered 2017-09-26: 80 mg

## 2017-09-26 NOTE — Progress Notes (Signed)
   Subjective:  Jerry Owens is a 42 y.o. male who presents today for same-day appointment with a chief complaint of cough.   HPI:  Cough Started about 10 days ago.  Symptoms were initially improving however have worsened over the last 1-2 days.  Associated symptoms include rhinorrhea, sore throat, nasal congestion, and headache.  Tried taking over-the-counter medications which have not helped.  Also with some associated night sweats and chills.  No other obvious alleviating or aggravating factors.  ROS: Per HPI  PMH: He reports that he quit smoking about 12 years ago. His smoking use included cigarettes. He has never used smokeless tobacco. He reports that he drinks alcohol. He reports that he does not use drugs.  Objective:  Physical Exam: BP 132/64   Pulse 82   Temp 98.5 F (36.9 C)   Wt 205 lb (93 kg)   SpO2 96%   BMI 31.17 kg/m   Gen: NAD, resting comfortably HEENT: Right TM clear.  Left TM erythematous with clear effusion.  Oropharynx slightly erythematous.  Nasal mucosa erythematous and boggy with clear nasal discharge.  Maxillary sinuses with decreased transillumination bilaterally. CV: RRR with no murmurs appreciated Pulm: NWOB, CTAB with no crackles, wheezes, or rhonchi  Assessment/Plan:  Cough/Sinusitis Given 10-day course and worsening of symptoms, we will start doxycycline today. Start atrovent for rhinorrhea/sinus congestion. Will give 80mg  IM depo-medrol for sore throat. Start guaifenesin-codeine for cough. Recommended tylenol and/or motrin as needed for low grade fever and pain. Encouraged good oral hydration. Return precautions reviewed. Follow up as needed.    Katina Degreealeb M. Jimmey RalphParker, MD 09/26/2017 3:00 PM

## 2017-09-26 NOTE — Patient Instructions (Signed)
Start the atrovent and doxycycline.   Start cough syrup for your cough.  Please stay well hydrated.  You can take tylenol and/or motrin as needed for low grade fever and pain.  Please let me know if your symptoms worsen or fail to improve.  Take care, Dr Rhyen Mazariego  

## 2017-11-22 ENCOUNTER — Encounter: Payer: Self-pay | Admitting: Family Medicine

## 2017-11-22 ENCOUNTER — Ambulatory Visit (INDEPENDENT_AMBULATORY_CARE_PROVIDER_SITE_OTHER): Payer: 59 | Admitting: Family Medicine

## 2017-11-22 VITALS — BP 130/80 | HR 67 | Temp 97.6°F | Resp 12 | Ht 67.5 in | Wt 197.0 lb

## 2017-11-22 DIAGNOSIS — R5383 Other fatigue: Secondary | ICD-10-CM | POA: Diagnosis not present

## 2017-11-22 DIAGNOSIS — F4321 Adjustment disorder with depressed mood: Secondary | ICD-10-CM | POA: Diagnosis not present

## 2017-11-22 DIAGNOSIS — Z13220 Encounter for screening for lipoid disorders: Secondary | ICD-10-CM

## 2017-11-22 DIAGNOSIS — Z0001 Encounter for general adult medical examination with abnormal findings: Secondary | ICD-10-CM | POA: Diagnosis not present

## 2017-11-22 DIAGNOSIS — R61 Generalized hyperhidrosis: Secondary | ICD-10-CM | POA: Diagnosis not present

## 2017-11-22 DIAGNOSIS — E039 Hypothyroidism, unspecified: Secondary | ICD-10-CM

## 2017-11-22 DIAGNOSIS — Z114 Encounter for screening for human immunodeficiency virus [HIV]: Secondary | ICD-10-CM

## 2017-11-22 LAB — COMPREHENSIVE METABOLIC PANEL
ALBUMIN: 4.8 g/dL (ref 3.5–5.2)
ALK PHOS: 53 U/L (ref 39–117)
ALT: 36 U/L (ref 0–53)
AST: 18 U/L (ref 0–37)
BILIRUBIN TOTAL: 0.8 mg/dL (ref 0.2–1.2)
BUN: 11 mg/dL (ref 6–23)
CO2: 33 mEq/L — ABNORMAL HIGH (ref 19–32)
CREATININE: 0.97 mg/dL (ref 0.40–1.50)
Calcium: 9.7 mg/dL (ref 8.4–10.5)
Chloride: 101 mEq/L (ref 96–112)
GFR: 89.21 mL/min (ref >60.00)
GLUCOSE: 104 mg/dL — AB (ref 70–99)
POTASSIUM: 4.2 meq/L (ref 3.5–5.1)
SODIUM: 141 meq/L (ref 135–145)
TOTAL PROTEIN: 7.6 g/dL (ref 6.0–8.3)

## 2017-11-22 LAB — LIPID PANEL
CHOLESTEROL: 174 mg/dL (ref 0–200)
HDL: 38 mg/dL — ABNORMAL LOW (ref >39.00)
LDL CALC: 117 mg/dL — AB (ref 0–99)
NONHDL: 135.73
Total CHOL/HDL Ratio: 5
Triglycerides: 95 mg/dL (ref 0.0–149.0)
VLDL: 19 mg/dL (ref 0.0–40.0)

## 2017-11-22 LAB — CBC
HEMATOCRIT: 50 % (ref 39.0–52.0)
Hemoglobin: 16.9 g/dL (ref 13.0–17.0)
MCHC: 33.8 g/dL (ref 30.0–36.0)
MCV: 92.6 fl (ref 78.0–100.0)
Platelets: 308 10*3/uL (ref 150.0–400.0)
RBC: 5.4 Mil/uL (ref 4.22–5.81)
RDW: 13.6 % (ref 11.5–15.5)
WBC: 8 10*3/uL (ref 4.0–10.5)

## 2017-11-22 LAB — VITAMIN B12: Vitamin B-12: 242 pg/mL (ref 211–911)

## 2017-11-22 LAB — TESTOSTERONE: TESTOSTERONE: 320.18 ng/dL (ref 300.00–890.00)

## 2017-11-22 LAB — TSH: TSH: 0.83 u[IU]/mL (ref 0.35–4.50)

## 2017-11-22 LAB — VITAMIN D 25 HYDROXY (VIT D DEFICIENCY, FRACTURES): VITD: 27.9 ng/mL — AB (ref 30.00–100.00)

## 2017-11-22 NOTE — Progress Notes (Signed)
Subjective:  Jerry Owens is a 42 y.o. male who presents today for his annual comprehensive physical exam.    HPI:  He has a few acute complaints outlined below:  1. Increased stress levels. Has been ongoing for several months, but worsened recently. His parents have both been going through significant health problems. Mother recently diagnosed with breat cancer and father has been having heart difficulties. Pt is an only son and has very little family support. Pt thinks he has been dealing it to the best of his abilities.   2. Night sweats. Pt also notes increased sweatiness on his pillow at night for the past 1-2 weeks.   3. Skin Tags. Located on bilateral eye lids and inner left thigh.   Lifestyle Diet/Exercise: No specific diets or exercises  Depression screen PHQ 2/9 11/22/2017  Decreased Interest 1  Down, Depressed, Hopeless 1  PHQ - 2 Score 2  Altered sleeping 1  Tired, decreased energy 1  Change in appetite 0  Feeling bad or failure about yourself  0  Trouble concentrating 0  Moving slowly or fidgety/restless 0  Suicidal thoughts 0  PHQ-9 Score 4  Difficult doing work/chores Not difficult at all    Health Maintenance Due  Topic Date Due  . HIV Screening  07/13/1988     ROS: Per HPI, otherwise a complete review of systems was negative.   PMH:  The following were reviewed and entered/updated in epic: Past Medical History:  Diagnosis Date  . Complication of anesthesia    hard to wake up  . Hypothyroidism   . PONV (postoperative nausea and vomiting)   . Rupture of right distal biceps tendon    Patient Active Problem List   Diagnosis Date Noted  . Hypothyroidism 07/28/2017  . 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 Rupture of right distal biceps tendon 05/18/2017  . Impingement syndrome of right shoulder 05/20/2016   Past Surgical History:  Procedure Laterality Date  . COLONOSCOPY WITH PROPOFOL N/A 12/22/2015   Procedure: COLONOSCOPY WITH PROPOFOL;   Surgeon: Charolett Bumpers, MD;  Location: [facility];  Service: Endoscopy;  Laterality: N/A;  . DISTAL BICEPS TENDON REPAIR Right 05/18/2017   Procedure: RIGHT DISTAL BICEPS REPAIR;  Surgeon: Tarry Kos, MD;  Location: [facility];  Service: Orthopedics;  Laterality: Right;  . SHOULDER SURGERY     left rotator cuff surgery   History reviewed. No pertinent family history.  Medications- reviewed and updated Current Outpatient Medications  Medication Sig Dispense Refill  . Ascorbic Acid (VITAMIN C) 100 MG tablet Take 100 mg by mouth daily.    . finasteride (PROSCAR) 5 MG tablet Take 1.25 mg by mouth daily.    Marland Kitchen levothyroxine (SYNTHROID, LEVOTHROID) 112 MCG tablet Take 112 mcg by mouth daily before breakfast.     No current facility-administered medications for this visit.     Allergies-reviewed and updated Allergies  Allergen Reactions  . Amoxicillin     Swelling in face, neck, ears, and chest  . Penicillins     Social History   Socioeconomic History  . Marital status: Married    Spouse name: Not on file  . Number of children: 2  . Years of education: Not on file  . Highest education level: Not on file  Occupational History  . Not on file  Social Needs  . Financial resource strain: Not on file  . Food insecurity:    Worry: Not on file    Inability: Not on file  .  Transportation needs:    Medical: Not on file    Non-medical: Not on file  Tobacco Use  . Smoking status: Former Smoker    Types: Cigarettes    Last attempt to quit: 11/21/2004    Years since quitting: 13.0  . Smokeless tobacco: Never Used  Substance and Sexual Activity  . Alcohol use: Yes    Comment: social  . Drug use: No  . Sexual activity: Yes  Lifestyle  . Physical activity:    Days per week: Not on file    Minutes per session: Not on file  . Stress: Not on file  Relationships  . Social connections:    Talks on phone: Not on file    Gets together: Not on file    Attends  religious service: Not on file    Active member of club or organization: Not on file    Attends meetings of clubs or organizations: Not on file    Relationship status: Not on file  Other Topics Concern  . Not on file  Social History Narrative  . Not on file    Objective:  Physical Exam: BP 130/80   Pulse 67   Temp 97.6 F (36.4 C) (Oral)   Resp 12   Ht 5' 7.5" (1.715 m)   Wt 197 lb (89.4 kg)   SpO2 96%   BMI 30.40 kg/m   Body mass index is 30.4 kg/m. Wt Readings from Last 3 Encounters:  11/22/17 197 lb (89.4 kg)  09/26/17 205 lb (93 kg)  07/28/17 206 lb 6.4 oz (93.6 kg)   Gen: NAD, resting comfortably HEENT: TMs normal bilaterally. OP clear. No thyromegaly noted. Small skin tags noted on bilateral upper eyelids.  CV: RRR with no murmurs appreciated Pulm: NWOB, CTAB with no crackles, wheezes, or rhonchi GI: Normal bowel sounds present. Soft, Nontender, Nondistended. MSK: no edema, cyanosis, or clubbing noted Skin: warm, dry Neuro: CN2-12 grossly intact. Strength 5/5 in upper and lower extremities. Reflexes symmetric and intact bilaterally.  Psych: Normal affect and thought content  Assessment/Plan:  Adjustment Disorder with Depressed Mood Offered words of support to pt. Discussed treatment options including meds and psychotherapy, however pt deferred for today.  Fatigue/Night Sweats Check CBC, CMET, TSH, B12, Vitamin D, and testosterone levels.   Hypothyroidism Check TSH. Continue levothyroxine daily.  Preventative Healthcare: Check HIV antibody and lipid panel.   Patient Counseling(The following topics were reviewed and/or handout was given):  -Nutrition: Stressed importance of moderation in sodium/caffeine intake, saturated fat and cholesterol, caloric balance, sufficient intake of fresh fruits, vegetables, and fiber.  -Stressed the importance of regular exercise.   -Substance Abuse: Discussed cessation/primary prevention of tobacco, alcohol, or other  drug use; driving or other dangerous activities under the influence; availability of treatment for abuse.   -Injury prevention: Discussed safety belts, safety helmets, smoke detector, smoking near bedding or upholstery.   -Sexuality: Discussed sexually transmitted diseases, partner selection, use of condoms, avoidance of unintended pregnancy and contraceptive alternatives.   -Dental health: Discussed importance of regular tooth brushing, flossing, and dental visits.  -Health maintenance and immunizations reviewed. Please refer to Health maintenance section.  Return to care in 1 year for next preventative visit.   Katina Degree. [name], MD 11/22/2017 10:42 AM

## 2017-11-22 NOTE — Assessment & Plan Note (Signed)
Check TSH.  Continue levothyroxine 112 mcg daily. 

## 2017-11-22 NOTE — Patient Instructions (Signed)
It was very nice to see you today!  Please let me know if you are interested in treatment to help with your stress levels. I know this is a difficulty time, but it will get better.   We will check blood work today.  I am glad that you have lost 8 pounds. Please keep up the good work!  Take care, Dr Jerline Pain   Preventive Care 40-64 Years, Male Preventive care refers to lifestyle choices and visits with your health care provider that can promote health and wellness. What does preventive care include?  A yearly physical exam. This is also called an annual well check.  Dental exams once or twice a year.  Routine eye exams. Ask your health care provider how often you should have your eyes checked.  Personal lifestyle choices, including: ? Daily care of your teeth and gums. ? Regular physical activity. ? Eating a healthy diet. ? Avoiding tobacco and drug use. ? Limiting alcohol use. ? Practicing safe sex. ? Taking low-dose aspirin every day starting at age 95. What happens during an annual well check? The services and screenings done by your health care provider during your annual well check will depend on your age, overall health, lifestyle risk factors, and family history of disease. Counseling Your health care provider may ask you questions about your:  Alcohol use.  Tobacco use.  Drug use.  Emotional well-being.  Home and relationship well-being.  Sexual activity.  Eating habits.  Work and work Statistician.  Screening You may have the following tests or measurements:  Height, weight, and BMI.  Blood pressure.  Lipid and cholesterol levels. These may be checked every 5 years, or more frequently if you are over 19 years old.  Skin check.  Lung cancer screening. You may have this screening every year starting at age 53 if you have a 30-pack-year history of smoking and currently smoke or have quit within the past 15 years.  Fecal occult blood test (FOBT) of the  stool. You may have this test every year starting at age 81.  Flexible sigmoidoscopy or colonoscopy. You may have a sigmoidoscopy every 5 years or a colonoscopy every 10 years starting at age 5.  Prostate cancer screening. Recommendations will vary depending on your family history and other risks.  Hepatitis C blood test.  Hepatitis B blood test.  Sexually transmitted disease (STD) testing.  Diabetes screening. This is done by checking your blood sugar (glucose) after you have not eaten for a while (fasting). You may have this done every 1-3 years.  Discuss your test results, treatment options, and if necessary, the need for more tests with your health care provider. Vaccines Your health care provider may recommend certain vaccines, such as:  Influenza vaccine. This is recommended every year.  Tetanus, diphtheria, and acellular pertussis (Tdap, Td) vaccine. You may need a Td booster every 10 years.  Varicella vaccine. You may need this if you have not been vaccinated.  Zoster vaccine. You may need this after age 7.  Measles, mumps, and rubella (MMR) vaccine. You may need at least one dose of MMR if you were born in 1957 or later. You may also need a second dose.  Pneumococcal 13-valent conjugate (PCV13) vaccine. You may need this if you have certain conditions and have not been vaccinated.  Pneumococcal polysaccharide (PPSV23) vaccine. You may need one or two doses if you smoke cigarettes or if you have certain conditions.  Meningococcal vaccine. You may need this if you  have certain conditions.  Hepatitis A vaccine. You may need this if you have certain conditions or if you travel or work in places where you may be exposed to hepatitis A.  Hepatitis B vaccine. You may need this if you have certain conditions or if you travel or work in places where you may be exposed to hepatitis B.  Haemophilus influenzae type b (Hib) vaccine. You may need this if you have certain risk  factors.  Talk to your health care provider about which screenings and vaccines you need and how often you need them. This information is not intended to replace advice given to you by your health care provider. Make sure you discuss any questions you have with your health care provider. Document Released: 07/18/2015 Document Revised: 03/10/2016 Document Reviewed: 04/22/2015 Elsevier Interactive Patient Education  [employer].

## 2017-11-23 ENCOUNTER — Encounter: Payer: Self-pay | Admitting: Family Medicine

## 2017-11-23 DIAGNOSIS — E785 Hyperlipidemia, unspecified: Secondary | ICD-10-CM | POA: Insufficient documentation

## 2017-11-23 LAB — HIV ANTIBODY (ROUTINE TESTING W REFLEX): HIV 1&2 Ab, 4th Generation: NONREACTIVE

## 2018-01-12 ENCOUNTER — Telehealth: Payer: Self-pay | Admitting: Family Medicine

## 2018-01-12 ENCOUNTER — Other Ambulatory Visit: Payer: Self-pay

## 2018-01-12 MED ORDER — FINASTERIDE 5 MG PO TABS: 5.0000 mg | ORAL_TABLET | Freq: Every day | ORAL | 3 refills | Status: DC

## 2018-01-12 NOTE — Telephone Encounter (Signed)
Ok with me. Please place any necessary orders. 

## 2018-01-12 NOTE — Telephone Encounter (Signed)
LOV  11/22/17 Dr. Jimmey RalphParker

## 2018-01-12 NOTE — Telephone Encounter (Signed)
See note

## 2018-01-12 NOTE — Telephone Encounter (Signed)
Please advise.  Historical provider. 

## 2018-01-12 NOTE — Telephone Encounter (Signed)
Copied from CRM [id number]. Topic: Inquiry >> Jan 12, 2018  9:46 AM Stephannie LiSimmons, Wisdom Rickey L, NT wrote: Reason for CRM: Patient would like for Dr Jimmey RalphParker to fill a prescription for finasteride (PROSCAR) 5 MG tablet, it was last filled by Dr Nehemiah SettlePolite h says and he would like it sent to [facility] [address] - [place], [place] - [address] [phone] (Phone) [phone] (Fax   Please call the patient at  [phone] if needed

## 2018-01-12 NOTE — Telephone Encounter (Signed)
Rx sent to pharmacy   

## 2018-07-26 ENCOUNTER — Ambulatory Visit: Payer: 59 | Admitting: Family Medicine

## 2018-07-26 ENCOUNTER — Encounter: Payer: Self-pay | Admitting: Family Medicine

## 2018-07-26 VITALS — BP 132/82 | HR 82 | Temp 97.7°F | Ht 68.0 in | Wt 194.2 lb

## 2018-07-26 DIAGNOSIS — J329 Chronic sinusitis, unspecified: Secondary | ICD-10-CM

## 2018-07-26 DIAGNOSIS — L649 Androgenic alopecia, unspecified: Secondary | ICD-10-CM | POA: Diagnosis not present

## 2018-07-26 DIAGNOSIS — E039 Hypothyroidism, unspecified: Secondary | ICD-10-CM | POA: Diagnosis not present

## 2018-07-26 DIAGNOSIS — J029 Acute pharyngitis, unspecified: Secondary | ICD-10-CM

## 2018-07-26 MED ORDER — DOXYCYCLINE HYCLATE 100 MG PO TABS: 100.0000 mg | ORAL_TABLET | Freq: Two times a day (BID) | ORAL | 0 refills | Status: DC

## 2018-07-26 MED ORDER — METHYLPREDNISOLONE ACETATE 80 MG/ML IJ SUSP
80.0000 mg | Freq: Once | INTRAMUSCULAR | Status: AC
  Administered 2018-07-26: 80 mg via INTRAMUSCULAR

## 2018-07-26 MED ORDER — IPRATROPIUM BROMIDE 0.06 % NA SOLN: 2.0000 | Freq: Four times a day (QID) | NASAL | 0 refills | Status: DC

## 2018-07-26 MED ORDER — FINASTERIDE 5 MG PO TABS: ORAL_TABLET | ORAL | 3 refills | Status: DC

## 2018-07-26 NOTE — Patient Instructions (Signed)
Start the atrovent and doxycycline.  We will give you a cortisone shot today.   Please stay well hydrated.  You can take tylenol and/or motrin as needed for low grade fever and pain.  Please let me know if your symptoms worsen or fail to improve.  I will send in more of your finasteride.  Please come back in a few months for your annual physical.   Take care, Dr [name]

## 2018-07-26 NOTE — Assessment & Plan Note (Signed)
Doing well on finasteride.  Sent in a refill today.

## 2018-07-26 NOTE — Progress Notes (Signed)
   Subjective:  Jerry Owens is a 43 y.o. male who presents today for same-day appointment with a chief complaint of sinus congestion.   HPI:  Sinus Congestion, Acute problem Started a few weeks ago. Worsened over the last few days. Associated with headache, sneeze, post nasal drip, sore throat. No fevers or chills. Tried sinus spray which helped.  No sick contacts. No other obvious alleviating or aggravating factors.   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  # Hypothyroidism -Currently on Synthroid 112 mcg daily and tolerating well. - No hot or cold intolerance.  No constipation or diarrhea.  ROS: Per HPI  PMH: He reports that he quit smoking about 13 years ago. His smoking use included cigarettes. He has never used smokeless tobacco. He reports current alcohol use. He reports that he does not use drugs.  Objective:  Physical Exam: BP 132/82 (BP Location: Left Arm, Patient Position: Sitting, Cuff Size: Small)   Pulse 82   Temp 97.7 F (36.5 C) (Oral)   Ht 5\' 8"  (1.727 m)   Wt 194 lb 3.2 oz (88.1 kg)   SpO2 97%   BMI 29.53 kg/m   Gen: NAD, resting comfortably HEENT: TMs with clear effusion.  OP erythematous with no exudate.  Nasal mucosa erythematous and boggy bilaterally with thick, green discharge. CV: RRR with no murmurs appreciated Pulm: NWOB, CTAB with no crackles, wheezes, or rhonchi  Assessment/Plan:  Sinusitis Given the symptoms have been persistent for the past 2 to 3 weeks and worsening, will start doxycycline today.  Also start Atrovent nasal spray will give 80 mg IM Depo-Medrol for his sore throat.  Encouraged good hydration.  Recommended Tylenol as/or Motrin as needed.  Discussed reasons return to care.  Follow-up as needed.  Hypothyroidism Continue Synthroid 112 mcg daily.  Will check TSH with next blood draw.  Androgenic alopecia Doing well on  finasteride.  Sent in a refill today.  Katina Degree. [name], MD 07/26/2018 10:51 AM

## 2018-07-26 NOTE — Assessment & Plan Note (Signed)
Continue Synthroid 112 mcg daily.  Will check TSH with next blood draw.

## 2019-01-05 ENCOUNTER — Other Ambulatory Visit: Payer: Self-pay | Admitting: Family Medicine

## 2019-02-01 ENCOUNTER — Telehealth: Payer: Self-pay | Admitting: Family Medicine

## 2019-02-01 NOTE — Telephone Encounter (Signed)
Dr. Jerline Pain, please see message. Pt is scheduled to see you 02/22/19 for physical.

## 2019-02-01 NOTE — Telephone Encounter (Signed)
Patients wife called in to make his CPE. Patient wife is worried about him since his father has passed in 2022/07/22 seems down all the time and would like for Dr. Jerline Pain to talk to him when he comes in for his CPE.

## 2019-02-01 NOTE — Telephone Encounter (Signed)
Noted.  Algis Greenhouse. Jerline Pain, MD 02/01/2019 4:43 PM

## 2019-02-22 ENCOUNTER — Ambulatory Visit (INDEPENDENT_AMBULATORY_CARE_PROVIDER_SITE_OTHER): Payer: 59 | Admitting: Family Medicine

## 2019-02-22 ENCOUNTER — Encounter: Payer: Self-pay | Admitting: Family Medicine

## 2019-02-22 ENCOUNTER — Other Ambulatory Visit: Payer: Self-pay

## 2019-02-22 VITALS — BP 128/82 | HR 90 | Temp 98.3°F | Ht 68.0 in | Wt 183.4 lb

## 2019-02-22 DIAGNOSIS — L649 Androgenic alopecia, unspecified: Secondary | ICD-10-CM | POA: Diagnosis not present

## 2019-02-22 DIAGNOSIS — Z0001 Encounter for general adult medical examination with abnormal findings: Secondary | ICD-10-CM

## 2019-02-22 DIAGNOSIS — Z634 Disappearance and death of family member: Secondary | ICD-10-CM | POA: Diagnosis not present

## 2019-02-22 DIAGNOSIS — E785 Hyperlipidemia, unspecified: Secondary | ICD-10-CM

## 2019-02-22 DIAGNOSIS — E039 Hypothyroidism, unspecified: Secondary | ICD-10-CM | POA: Diagnosis not present

## 2019-02-22 DIAGNOSIS — E663 Overweight: Secondary | ICD-10-CM

## 2019-02-22 DIAGNOSIS — Z6827 Body mass index (BMI) 27.0-27.9, adult: Secondary | ICD-10-CM

## 2019-02-22 LAB — CBC
HCT: 51.8 % (ref 39.0–52.0)
Hemoglobin: 17.4 g/dL — ABNORMAL HIGH (ref 13.0–17.0)
MCHC: 33.6 g/dL (ref 30.0–36.0)
MCV: 91.5 fl (ref 78.0–100.0)
Platelets: 327 10*3/uL (ref 150.0–400.0)
RBC: 5.66 Mil/uL (ref 4.22–5.81)
RDW: 13 % (ref 11.5–15.5)
WBC: 9.7 10*3/uL (ref 4.0–10.5)

## 2019-02-22 LAB — TSH: TSH: 0.25 u[IU]/mL — ABNORMAL LOW (ref 0.35–4.50)

## 2019-02-22 LAB — COMPREHENSIVE METABOLIC PANEL
ALT: 21 U/L (ref 0–53)
AST: 14 U/L (ref 0–37)
Albumin: 5.1 g/dL (ref 3.5–5.2)
Alkaline Phosphatase: 58 U/L (ref 39–117)
BUN: 8 mg/dL (ref 6–23)
CO2: 31 mEq/L (ref 19–32)
Calcium: 9.8 mg/dL (ref 8.4–10.5)
Chloride: 101 mEq/L (ref 96–112)
Creatinine, Ser: 0.92 mg/dL (ref 0.40–1.50)
GFR: 88.72 mL/min (ref >60.00)
Glucose, Bld: 84 mg/dL (ref 70–99)
Potassium: 4.1 mEq/L (ref 3.5–5.1)
Sodium: 139 mEq/L (ref 135–145)
Total Bilirubin: 0.4 mg/dL (ref 0.2–1.2)
Total Protein: 7.5 g/dL (ref 6.0–8.3)

## 2019-02-22 LAB — LIPID PANEL
Cholesterol: 175 mg/dL (ref 0–200)
HDL: 40.4 mg/dL (ref >39.00)
LDL Cholesterol: 105 mg/dL — ABNORMAL HIGH (ref 0–99)
NonHDL: 134.25
Total CHOL/HDL Ratio: 4
Triglycerides: 147 mg/dL (ref 0.0–149.0)
VLDL: 29.4 mg/dL (ref 0.0–40.0)

## 2019-02-22 MED ORDER — CITALOPRAM HYDROBROMIDE 20 MG PO TABS: 20.0000 mg | ORAL_TABLET | Freq: Every day | ORAL | 3 refills | Status: DC

## 2019-02-22 NOTE — Patient Instructions (Signed)
It was very nice to see you today!  Please start the celexa.Please give me a call in a few weeks to check in.   We will check blood work today.  No other changes.  Come back to see me in 1 year or sooner if needed.   Take care, Dr Jerline Pain  Please try these tips to maintain a healthy lifestyle:   Eat at least 3 REAL meals and 1-2 snacks per day.  Aim for no more than 5 hours between eating.  If you eat breakfast, please do so within one hour of getting up.    Obtain twice as many fruits/vegetables as protein or carbohydrate foods for both lunch and dinner. (Half of each meal should be fruits/vegetables, one quarter protein, and one quarter starchy carbs)   Cut down on sweet beverages. This includes juice, soda, and sweet tea.    Exercise at least 150 minutes every week.    Preventive Care 16-67 Years Old, Male Preventive care refers to lifestyle choices and visits with your health care provider that can promote health and wellness. This includes:  A yearly physical exam. This is also called an annual well check.  Regular dental and eye exams.  Immunizations.  Screening for certain conditions.  Healthy lifestyle choices, such as eating a healthy diet, getting regular exercise, not using drugs or products that contain nicotine and tobacco, and limiting alcohol use. What can I expect for my preventive care visit? Physical exam Your health care provider will check:  Height and weight. These may be used to calculate body mass index (BMI), which is a measurement that tells if you are at a healthy weight.  Heart rate and blood pressure.  Your skin for abnormal spots. Counseling Your health care provider may ask you questions about:  Alcohol, tobacco, and drug use.  Emotional well-being.  Home and relationship well-being.  Sexual activity.  Eating habits.  Work and work Statistician. What immunizations do I need?  Influenza (flu) vaccine  This is recommended  every year. Tetanus, diphtheria, and pertussis (Tdap) vaccine  You may need a Td booster every 10 years. Varicella (chickenpox) vaccine  You may need this vaccine if you have not already been vaccinated. Zoster (shingles) vaccine  You may need this after age 99. Measles, mumps, and rubella (MMR) vaccine  You may need at least one dose of MMR if you were born in 1957 or later. You may also need a second dose. Pneumococcal conjugate (PCV13) vaccine  You may need this if you have certain conditions and were not previously vaccinated. Pneumococcal polysaccharide (PPSV23) vaccine  You may need one or two doses if you smoke cigarettes or if you have certain conditions. Meningococcal conjugate (MenACWY) vaccine  You may need this if you have certain conditions. Hepatitis A vaccine  You may need this if you have certain conditions or if you travel or work in places where you may be exposed to hepatitis A. Hepatitis B vaccine  You may need this if you have certain conditions or if you travel or work in places where you may be exposed to hepatitis B. Haemophilus influenzae type b (Hib) vaccine  You may need this if you have certain risk factors. Human papillomavirus (HPV) vaccine  If recommended by your health care provider, you may need three doses over 6 months. You may receive vaccines as individual doses or as more than one vaccine together in one shot (combination vaccines). Talk with your health care provider about the  risks and benefits of combination vaccines. What tests do I need? Blood tests  Lipid and cholesterol levels. These may be checked every 5 years, or more frequently if you are over 52 years old.  Hepatitis C test.  Hepatitis B test. Screening  Lung cancer screening. You may have this screening every year starting at age 9 if you have a 30-pack-year history of smoking and currently smoke or have quit within the past 15 years.  Prostate cancer screening.  Recommendations will vary depending on your family history and other risks.  Colorectal cancer screening. All adults should have this screening starting at age 56 and continuing until age 23. Your health care provider may recommend screening at age 68 if you are at increased risk. You will have tests every 1-10 years, depending on your results and the type of screening test.  Diabetes screening. This is done by checking your blood sugar (glucose) after you have not eaten for a while (fasting). You may have this done every 1-3 years.  Sexually transmitted disease (STD) testing. Follow these instructions at home: Eating and drinking  Eat a diet that includes fresh fruits and vegetables, whole grains, lean protein, and low-fat dairy products.  Take vitamin and mineral supplements as recommended by your health care provider.  Do not drink alcohol if your health care provider tells you not to drink.  If you drink alcohol: ? Limit how much you have to 0-2 drinks a day. ? Be aware of how much alcohol is in your drink. In the [place] one drink equals one 12 oz bottle of beer (355 mL), one 5 oz glass of wine (148 mL), or one 1 oz glass of hard liquor (44 mL). Lifestyle  Take daily care of your teeth and gums.  Stay active. Exercise for at least 30 minutes on 5 or more days each week.  Do not use any products that contain nicotine or tobacco, such as cigarettes, e-cigarettes, and chewing tobacco. If you need help quitting, ask your health care provider.  If you are sexually active, practice safe sex. Use a condom or other form of protection to prevent STIs (sexually transmitted infections).  Talk with your health care provider about taking a low-dose aspirin every day starting at age 69. What's next?  Go to your health care provider once a year for a well check visit.  Ask your health care provider how often you should have your eyes and teeth checked.  Stay up to date on all vaccines. This  information is not intended to replace advice given to you by your health care provider. Make sure you discuss any questions you have with your health care provider. Document Released: 07/18/2015 Document Revised: 06/15/2018 Document Reviewed: 06/15/2018 Elsevier Patient Education  2020 [employer].

## 2019-02-22 NOTE — Assessment & Plan Note (Signed)
Stable finasteride 1.25 mg daily.

## 2019-02-22 NOTE — Assessment & Plan Note (Signed)
-   Continue Synthroid 112 mcg daily.  - Check TSH.

## 2019-02-22 NOTE — Progress Notes (Signed)
Chief Complaint:  Jerry Owens is a 43 y.o. male who presents today for his annual comprehensive physical exam.    Assessment/Plan:  Dyslipidemia Check lipid panel.  Check CBC, C met, and TSH.  Androgenic alopecia Stable finasteride 1.25 mg daily.  Hypothyroidism Continue Synthroid 112 mcg daily.  Check TSH.  Complicated Bereavement  May have underlying major depression as well.  Given symptoms been going on for about 7 months at this point time he is still having significant amount of depression symptoms, will start Celexa 20 mg daily.  Discussed potential side effects.  Also recommended referral to therapy-patient stated he would look into this.  He will follow up with me in a few weeks.   Body mass index is 27.89 kg/m. / Overweight BMI Metric Follow Up - 02/22/19 1336      BMI Metric Follow Up-Please document annually   BMI Metric Follow Up  Education provided        Preventative Healthcare: Flu shot deferred.  Check CBC, C met, TSH, and lipid panel.  Patient Counseling(The following topics were reviewed and/or handout was given):  -Nutrition: Stressed importance of moderation in sodium/caffeine intake, saturated fat and cholesterol, caloric balance, sufficient intake of fresh fruits, vegetables, and fiber.  -Stressed the importance of regular exercise.   -Substance Abuse: Discussed cessation/primary prevention of tobacco, alcohol, or other drug use; driving or other dangerous activities under the influence; availability of treatment for abuse.   -Injury prevention: Discussed safety belts, safety helmets, smoke detector, smoking near bedding or upholstery.   -Sexuality: Discussed sexually transmitted diseases, partner selection, use of condoms, avoidance of unintended pregnancy and contraceptive alternatives.   -Dental health: Discussed importance of regular tooth brushing, flossing, and dental visits.  -Health maintenance and immunizations reviewed. Please refer to  Health maintenance section.  Return to care in 1 year for next preventative visit.     Subjective:  HPI:  He has no acute complaints today.   Patient's fatherpassed weight earlier this year.  He has had some difficulty dealing with this.  Often feels down and depressed.  Has low energy.  Things are getting better though still has significant amount of depressive symptoms.  He has not yet sought treatment for this.   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  # Hypothyroidism -Currently on Synthroid 112 mcg daily and tolerating well. - No hot or cold intolerance.  No constipation or diarrhea.  Lifestyle Diet: Balanced. Tries to eat a balanced and healthy diet.  Exercise: Does not exercise regularly due to COVID.   Depression screen [facility] 2/9 07/26/2018  Decreased Interest 0  Down, Depressed, Hopeless 0  PHQ - 2 Score 0  Altered sleeping -  Tired, decreased energy -  Change in appetite -  Feeling bad or failure about yourself  -  Trouble concentrating -  Moving slowly or fidgety/restless -  Suicidal thoughts -  PHQ-9 Score -  Difficult doing work/chores -    There are no preventive care reminders to display for this patient.   ROS: Per HPI, otherwise a complete review of systems was negative.   PMH:  The following were reviewed and entered/updated in epic: Past Medical History:  Diagnosis Date  . Complication of anesthesia    hard to wake up  . Hypothyroidism   . PONV (postoperative nausea and vomiting)   . Rupture of right distal biceps tendon  Patient Active Problem List   Diagnosis Date Noted  . Dyslipidemia 11/23/2017  . Hypothyroidism 07/28/2017  . 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 Rupture of right distal biceps tendon 05/18/2017  . Impingement syndrome of right shoulder 05/20/2016   Past Surgical History:  Procedure Laterality Date  .  COLONOSCOPY WITH PROPOFOL N/A 12/22/2015   Procedure: COLONOSCOPY WITH PROPOFOL;  Surgeon: Garlan Fair, MD;  Location: [facility];  Service: Endoscopy;  Laterality: N/A;  . DISTAL BICEPS TENDON REPAIR Right 05/18/2017   Procedure: RIGHT DISTAL BICEPS REPAIR;  Surgeon: Leandrew Koyanagi, MD;  Location: [facility];  Service: Orthopedics;  Laterality: Right;  . SHOULDER SURGERY     left rotator cuff surgery    History reviewed. No pertinent family history.  Medications- reviewed and updated Current Outpatient Medications  Medication Sig Dispense Refill  . Ascorbic Acid (VITAMIN C) 100 MG tablet Take 100 mg by mouth daily.    . finasteride (PROSCAR) 5 MG tablet Take 1 tablet by mouth once daily 30 tablet 0  . levothyroxine (SYNTHROID, LEVOTHROID) 112 MCG tablet Take 112 mcg by mouth daily before breakfast.    . citalopram (CELEXA) 20 MG tablet Take 1 tablet (20 mg total) by mouth daily. 30 tablet 3   No current facility-administered medications for this visit.     Allergies-reviewed and updated Allergies  Allergen Reactions  . Amoxicillin     Swelling in face, neck, ears, and chest  . Penicillins     Social History   Socioeconomic History  . Marital status: Married    Spouse name: Not on file  . Number of children: 2  . Years of education: Not on file  . Highest education level: Not on file  Occupational History  . Not on file  Social Needs  . Financial resource strain: Not on file  . Food insecurity    Worry: Not on file    Inability: Not on file  . Transportation needs    Medical: Not on file    Non-medical: Not on file  Tobacco Use  . Smoking status: Former Smoker    Types: Cigarettes    Quit date: 11/21/2004    Years since quitting: 14.2  . Smokeless tobacco: Never Used  Substance and Sexual Activity  . Alcohol use: Yes    Comment: social  . Drug use: No  . Sexual activity: Yes  Lifestyle  . Physical activity    Days per week: Not on file     Minutes per session: Not on file  . Stress: Not on file  Relationships  . Social Herbalist on phone: Not on file    Gets together: Not on file    Attends religious service: Not on file    Active member of club or organization: Not on file    Attends meetings of clubs or organizations: Not on file    Relationship status: Not on file  Other Topics Concern  . Not on file  Social History Narrative  . Not on file        Objective:  Physical Exam: BP 128/82 (BP Location: Left Arm, Patient Position: Sitting, Cuff Size: Normal)   Pulse 90   Temp 98.3 F (36.8 C) (Oral)   Ht 5' 8" (1.727 m)   Wt 183 lb 6.4 oz (83.2 kg)   SpO2 98%   BMI 27.89 kg/m   Body mass index is 27.89 kg/m. Wt Readings from Last 3 Encounters:  02/22/19  183 lb 6.4 oz (83.2 kg)  07/26/18 194 lb 3.2 oz (88.1 kg)  11/22/17 197 lb (89.4 kg)   Gen: NAD, resting comfortably HEENT: TMs normal bilaterally. OP clear. No thyromegaly noted.  CV: RRR with no murmurs appreciated Pulm: NWOB, CTAB with no crackles, wheezes, or rhonchi GI: Normal bowel sounds present. Soft, Nontender, Nondistended. MSK: no edema, cyanosis, or clubbing noted Skin: warm, dry Neuro: CN2-12 grossly intact. Strength 5/5 in upper and lower extremities. Reflexes symmetric and intact bilaterally.  Psych: Normal affect and thought content     Caleb M. Jerline Pain, MD 02/22/2019 1:38 PM

## 2019-02-22 NOTE — Assessment & Plan Note (Signed)
Check lipid panel.  Check CBC, C met, and TSH.

## 2019-02-23 ENCOUNTER — Other Ambulatory Visit: Payer: Self-pay

## 2019-02-23 MED ORDER — LEVOTHYROXINE SODIUM 100 MCG PO TABS: 100.0000 ug | ORAL_TABLET | Freq: Every day | ORAL | 3 refills | Status: DC

## 2019-02-23 NOTE — Progress Notes (Signed)
Please inform patient of the following:  He is getting a bit too much thyroid - need to decrease to 122mcg daily. Please send in new rx for patient. We can recheckin 4-6 weeks.  His "bad" cholesterol was just a little high but everything else looks great. Do not need to start any medications, but he should continue working on diet and exercise and we can recheck in a year.  Jerry Owens. Jerline Pain, MD 02/23/2019 11:53 AM

## 2019-02-27 NOTE — Progress Notes (Signed)
Please advise 

## 2019-02-28 ENCOUNTER — Other Ambulatory Visit: Payer: Self-pay

## 2019-02-28 DIAGNOSIS — E039 Hypothyroidism, unspecified: Secondary | ICD-10-CM

## 2019-04-05 ENCOUNTER — Other Ambulatory Visit: Payer: Self-pay

## 2019-04-05 ENCOUNTER — Telehealth: Payer: Self-pay | Admitting: Family Medicine

## 2019-04-05 ENCOUNTER — Other Ambulatory Visit (INDEPENDENT_AMBULATORY_CARE_PROVIDER_SITE_OTHER): Payer: Managed Care, Other (non HMO)

## 2019-04-05 DIAGNOSIS — E039 Hypothyroidism, unspecified: Secondary | ICD-10-CM | POA: Diagnosis not present

## 2019-04-05 LAB — TSH: TSH: 3.07 u[IU]/mL (ref 0.35–4.50)

## 2019-04-05 MED ORDER — FINASTERIDE 5 MG PO TABS: ORAL_TABLET | ORAL | 0 refills | Status: DC

## 2019-04-05 NOTE — Progress Notes (Signed)
Please inform patient of the following:  Thyroid levels are back to normal range. Would like for him to continue his current dose and we can recheck next time he gets his full set of blood work.  Jerry Owens. Jerline Pain, MD 04/05/2019 12:23 PM

## 2019-04-05 NOTE — Telephone Encounter (Signed)
Refill sent.

## 2019-04-05 NOTE — Telephone Encounter (Signed)
°  LAST APPOINTMENT DATE: @@LASTENCT @  NEXT APPOINTMENT DATE:@Visit  date not found  MEDICATION:finasteride (PROSCAR) 5 MG tablet  PHARMACY: [place], [place] - [address] [place] (Phone) [phone] (Fax)   Patient is out of the medication

## 2019-05-08 ENCOUNTER — Encounter: Payer: Self-pay | Admitting: Orthopaedic Surgery

## 2019-05-08 ENCOUNTER — Other Ambulatory Visit: Payer: Self-pay

## 2019-05-08 ENCOUNTER — Ambulatory Visit (INDEPENDENT_AMBULATORY_CARE_PROVIDER_SITE_OTHER): Payer: Managed Care, Other (non HMO)

## 2019-05-08 ENCOUNTER — Ambulatory Visit (INDEPENDENT_AMBULATORY_CARE_PROVIDER_SITE_OTHER): Payer: Managed Care, Other (non HMO) | Admitting: Orthopaedic Surgery

## 2019-05-08 VITALS — Ht 68.0 in | Wt 185.0 lb

## 2019-05-08 DIAGNOSIS — M25522 Pain in left elbow: Secondary | ICD-10-CM

## 2019-05-08 NOTE — Progress Notes (Signed)
Office Visit Note   Patient: Jerry Owens           Date of Birth: 16-Jun-1976           MRN: [id number] Visit Date: 05/08/2019              Requested by: Vivi Barrack, MD [address] [place],  [place] [address] PCP: Vivi Barrack, MD   Assessment & Plan: Visit Diagnoses:  1. Pain in left elbow     Plan: Impression is likely left distal biceps rupture.  Will obtain an MRI to further assess this.  He will follow up with [place] once as been completed.  In the meantime, he will ice and elevate and take over-the-counter NSAIDs for pain and swelling.  He will avoid any lifting of the left upper extremity.  We have written him out of work for the next 3 weeks.  Call with concerns or questions in the meantime.  Follow-Up Instructions: Return for after MRI.   Orders:  Orders Placed This Encounter  Procedures  . XR Elbow 2 Views Left   No orders of the defined types were placed in this encounter.     Procedures: No procedures performed   Clinical Data: No additional findings.   Subjective: Chief Complaint  Patient presents with  . Left Elbow - Pain    HPI patient is a pleasant 43 year old gentleman who presents our clinic today with a new injury to his left forearm.  About a week ago, he was lifting up a door frame at work when he felt 3 pops and immediate pain to the volar forearm.  He notes continued pain since.  He has associated bruising and weakness.  He does note previous right distal biceps rupture with subsequent surgical intervention by Dr.Abella Shugart 2 years ago.  Doing well there.  Review of Systems as detailed in HPI.  All others reviewed and are negative.   Objective: Vital Signs: Ht 5\' 8"  (1.727 m)   Wt 185 lb (83.9 kg)   BMI 28.13 kg/m   Physical Exam well-developed and well-nourished gentleman in no acute distress.  Alert and oriented x3.  Ortho Exam examination of the left forearm reveals moderate pain throughout antecubital fossa with associated ecchymosis.   Increased pain with elbow flexion, supination and pronation.  Decreased strength with resisted elbow flexion.  He is neurovascularly intact distally.  Specialty Comments:  No specialty comments available.  Imaging: Xr Elbow 2 Views Left  Result Date: 05/08/2019 No acute or structural abnormalities    PMFS History: Patient Active Problem List   Diagnosis Date Noted  . Dyslipidemia 11/23/2017  . Hypothyroidism 07/28/2017  . 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 Rupture of right distal biceps tendon 05/18/2017  . Impingement syndrome of right shoulder 05/20/2016   Past Medical History:  Diagnosis Date  . Complication of anesthesia    hard to wake up  . Hypothyroidism   . PONV (postoperative nausea and vomiting)   . Rupture of right distal biceps tendon     History reviewed. No pertinent family history.  Past Surgical History:  Procedure Laterality Date  . COLONOSCOPY WITH PROPOFOL N/A 12/22/2015   Procedure: COLONOSCOPY WITH PROPOFOL;  Surgeon: Garlan Fair, MD;  Location: [facility];  Service: Endoscopy;  Laterality: N/A;  . DISTAL BICEPS TENDON REPAIR Right 05/18/2017   Procedure: RIGHT DISTAL BICEPS REPAIR;  Surgeon: Leandrew Koyanagi, MD;  Location: [facility];  Service: Orthopedics;  Laterality: Right;  .  SHOULDER SURGERY     left rotator cuff surgery   Social History   Occupational History  . Not on file  Tobacco Use  . Smoking status: Former Smoker    Types: Cigarettes    Quit date: 11/21/2004    Years since quitting: 14.4  . Smokeless tobacco: Never Used  Substance and Sexual Activity  . Alcohol use: Yes    Comment: social  . Drug use: No  . Sexual activity: Yes

## 2019-05-09 NOTE — Addendum Note (Signed)
Addended by: Precious Bard on: 05/09/2019 09:11 AM   Modules accepted: Orders

## 2019-05-18 ENCOUNTER — Other Ambulatory Visit: Payer: Self-pay

## 2019-05-18 ENCOUNTER — Ambulatory Visit
Admission: RE | Admit: 2019-05-18 | Discharge: 2019-05-18 | Disposition: A | Discharge status: Home / Self Care | Payer: Managed Care, Other (non HMO) | Source: Ambulatory Visit | Attending: Physician Assistant | Admitting: Physician Assistant

## 2019-05-18 DIAGNOSIS — M25522 Pain in left elbow: Secondary | ICD-10-CM

## 2019-05-21 NOTE — Progress Notes (Signed)
F/u asap to discuss mri

## 2019-05-23 ENCOUNTER — Other Ambulatory Visit: Payer: Managed Care, Other (non HMO)

## 2019-05-23 ENCOUNTER — Other Ambulatory Visit: Payer: Self-pay

## 2019-05-23 ENCOUNTER — Encounter (HOSPITAL_BASED_OUTPATIENT_CLINIC_OR_DEPARTMENT_OTHER): Payer: Self-pay | Admitting: *Deleted

## 2019-05-23 ENCOUNTER — Encounter: Payer: Self-pay | Admitting: Orthopaedic Surgery

## 2019-05-23 ENCOUNTER — Ambulatory Visit (INDEPENDENT_AMBULATORY_CARE_PROVIDER_SITE_OTHER): Payer: Managed Care, Other (non HMO) | Admitting: Orthopaedic Surgery

## 2019-05-23 DIAGNOSIS — S46212A Strain of muscle, fascia and tendon of other parts of biceps, left arm, initial encounter: Secondary | ICD-10-CM

## 2019-05-23 NOTE — Progress Notes (Signed)
   Office Visit Note   Patient: Jerry Owens           Date of Birth: 03-17-1976           MRN: [id number] Visit Date: 05/23/2019              Requested by: Vivi Barrack, MD [address] [place],  [place] [address] PCP: Vivi Barrack, MD   Assessment & Plan: Visit Diagnoses:  1. Rupture of left distal biceps tendon, initial encounter     Plan: MRI reviewed with patient which shows complete rupture of the distal biceps with 1 cm of retraction.  Otherwise MRI is unremarkable.  These findings were reviewed with the patient in detail and based on discussion today patient has elected to proceed with repair of left distal biceps.  Risk benefits alternatives to surgery were again reviewed with the patient.  All questions answered to his satisfaction.  Follow-Up Instructions: Return for 1 week postop visit.   Orders:  No orders of the defined types were placed in this encounter.  No orders of the defined types were placed in this encounter.     Procedures: No procedures performed   Clinical Data: No additional findings.   Subjective: Chief Complaint  Patient presents with  . Left Elbow - Pain    [name] is back today for MRI review of the left elbow concerning for distal biceps rupture.  He is 2 years status post right distal biceps rupture and repair.  Date of injury for the left elbow is 05/08/2019.   Review of Systems   Objective: Vital Signs: There were no vitals taken for this visit.  Physical Exam  Ortho Exam Left elbow exam is unchanged. Specialty Comments:  No specialty comments available.  Imaging: No results found.   PMFS History: Patient Active Problem List   Diagnosis Date Noted  . Rupture of left distal biceps tendon 05/23/2019  . Dyslipidemia 11/23/2017  . Hypothyroidism 07/28/2017  . 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 Rupture of right distal biceps tendon 05/18/2017  . Impingement syndrome of right shoulder 05/20/2016   Past  Medical History:  Diagnosis Date  . Complication of anesthesia    hard to wake up  . Hypothyroidism   . PONV (postoperative nausea and vomiting)   . Rupture of right distal biceps tendon     History reviewed. No pertinent family history.  Past Surgical History:  Procedure Laterality Date  . COLONOSCOPY WITH PROPOFOL N/A 12/22/2015   Procedure: COLONOSCOPY WITH PROPOFOL;  Surgeon: Garlan Fair, MD;  Location: [facility];  Service: Endoscopy;  Laterality: N/A;  . DISTAL BICEPS TENDON REPAIR Right 05/18/2017   Procedure: RIGHT DISTAL BICEPS REPAIR;  Surgeon: Leandrew Koyanagi, MD;  Location: [facility];  Service: Orthopedics;  Laterality: Right;  . SHOULDER SURGERY     left rotator cuff surgery   Social History   Occupational History  . Not on file  Tobacco Use  . Smoking status: Former Smoker    Types: Cigarettes    Quit date: 11/21/2004    Years since quitting: 14.5  . Smokeless tobacco: Never Used  Substance and Sexual Activity  . Alcohol use: Yes    Comment: social  . Drug use: No  . Sexual activity: Yes

## 2019-05-24 ENCOUNTER — Other Ambulatory Visit: Payer: Self-pay

## 2019-05-24 ENCOUNTER — Encounter (HOSPITAL_BASED_OUTPATIENT_CLINIC_OR_DEPARTMENT_OTHER)
Admission: RE | Admit: 2019-05-24 | Discharge: 2019-05-24 | Disposition: A | Discharge status: Home / Self Care | Payer: 59 | Source: Ambulatory Visit | Attending: Orthopaedic Surgery | Admitting: Orthopaedic Surgery

## 2019-05-24 ENCOUNTER — Other Ambulatory Visit (HOSPITAL_COMMUNITY): Payer: 59 | Attending: Orthopaedic Surgery

## 2019-05-24 DIAGNOSIS — S46212A Strain of muscle, fascia and tendon of other parts of biceps, left arm, initial encounter: Secondary | ICD-10-CM | POA: Diagnosis not present

## 2019-05-24 DIAGNOSIS — Z79899 Other long term (current) drug therapy: Secondary | ICD-10-CM | POA: Diagnosis not present

## 2019-05-24 DIAGNOSIS — Z20822 Contact with and (suspected) exposure to covid-19: Secondary | ICD-10-CM

## 2019-05-24 DIAGNOSIS — Z880 Allergy status to penicillin: Secondary | ICD-10-CM | POA: Diagnosis not present

## 2019-05-24 DIAGNOSIS — Z87892 Personal history of anaphylaxis: Secondary | ICD-10-CM | POA: Diagnosis not present

## 2019-05-24 DIAGNOSIS — E039 Hypothyroidism, unspecified: Secondary | ICD-10-CM | POA: Diagnosis not present

## 2019-05-24 DIAGNOSIS — Z79890 Hormone replacement therapy (postmenopausal): Secondary | ICD-10-CM | POA: Diagnosis not present

## 2019-05-24 DIAGNOSIS — X58XXXA Exposure to other specified factors, initial encounter: Secondary | ICD-10-CM | POA: Diagnosis not present

## 2019-05-24 DIAGNOSIS — Z87891 Personal history of nicotine dependence: Secondary | ICD-10-CM | POA: Diagnosis not present

## 2019-05-24 LAB — BASIC METABOLIC PANEL
Anion gap: 12 (ref 5–15)
BUN: 7 mg/dL (ref 6–20)
CO2: 23 mmol/L (ref 22–32)
Calcium: 9.3 mg/dL (ref 8.9–10.3)
Chloride: 104 mmol/L (ref 98–111)
Creatinine, Ser: 0.79 mg/dL (ref 0.61–1.24)
GFR calc Af Amer: >60 mL/min (ref >60)
GFR calc non Af Amer: >60 mL/min (ref >60)
Glucose, Bld: 112 mg/dL — ABNORMAL HIGH (ref 70–99)
Potassium: 4.7 mmol/L (ref 3.5–5.1)
Sodium: 139 mmol/L (ref 135–145)

## 2019-05-25 NOTE — Progress Notes (Signed)

## 2019-05-26 ENCOUNTER — Other Ambulatory Visit (HOSPITAL_COMMUNITY)
Admission: RE | Admit: 2019-05-26 | Discharge: 2019-05-26 | Disposition: A | Discharge status: Home / Self Care | Payer: 59 | Source: Ambulatory Visit | Attending: Orthopaedic Surgery | Admitting: Orthopaedic Surgery

## 2019-05-26 DIAGNOSIS — Z01812 Encounter for preprocedural laboratory examination: Secondary | ICD-10-CM | POA: Diagnosis not present

## 2019-05-26 DIAGNOSIS — Z20828 Contact with and (suspected) exposure to other viral communicable diseases: Secondary | ICD-10-CM | POA: Diagnosis not present

## 2019-05-26 LAB — SARS CORONAVIRUS 2 (TAT 6-24 HRS): SARS Coronavirus 2: NEGATIVE

## 2019-05-28 ENCOUNTER — Ambulatory Visit (HOSPITAL_BASED_OUTPATIENT_CLINIC_OR_DEPARTMENT_OTHER)
Admission: RE | Admit: 2019-05-28 | Discharge: 2019-05-28 | Disposition: A | Discharge status: Home / Self Care | Payer: 59 | Attending: Orthopaedic Surgery | Admitting: Orthopaedic Surgery

## 2019-05-28 ENCOUNTER — Other Ambulatory Visit: Payer: Self-pay

## 2019-05-28 ENCOUNTER — Encounter (HOSPITAL_BASED_OUTPATIENT_CLINIC_OR_DEPARTMENT_OTHER)
Admission: RE | Disposition: A | Discharge status: Home / Self Care | Payer: Self-pay | Source: Home / Self Care | Attending: Orthopaedic Surgery

## 2019-05-28 ENCOUNTER — Encounter (HOSPITAL_BASED_OUTPATIENT_CLINIC_OR_DEPARTMENT_OTHER): Payer: Self-pay | Admitting: Anesthesiology

## 2019-05-28 ENCOUNTER — Ambulatory Visit (HOSPITAL_BASED_OUTPATIENT_CLINIC_OR_DEPARTMENT_OTHER): Payer: 59 | Admitting: Anesthesiology

## 2019-05-28 DIAGNOSIS — Z87892 Personal history of anaphylaxis: Secondary | ICD-10-CM | POA: Insufficient documentation

## 2019-05-28 DIAGNOSIS — X58XXXA Exposure to other specified factors, initial encounter: Secondary | ICD-10-CM | POA: Insufficient documentation

## 2019-05-28 DIAGNOSIS — Z880 Allergy status to penicillin: Secondary | ICD-10-CM | POA: Insufficient documentation

## 2019-05-28 DIAGNOSIS — E039 Hypothyroidism, unspecified: Secondary | ICD-10-CM | POA: Insufficient documentation

## 2019-05-28 DIAGNOSIS — Z87891 Personal history of nicotine dependence: Secondary | ICD-10-CM | POA: Insufficient documentation

## 2019-05-28 DIAGNOSIS — S46212A Strain of muscle, fascia and tendon of other parts of biceps, left arm, initial encounter: Secondary | ICD-10-CM | POA: Diagnosis present

## 2019-05-28 DIAGNOSIS — Z79890 Hormone replacement therapy (postmenopausal): Secondary | ICD-10-CM | POA: Insufficient documentation

## 2019-05-28 DIAGNOSIS — Z79899 Other long term (current) drug therapy: Secondary | ICD-10-CM | POA: Insufficient documentation

## 2019-05-28 HISTORY — PX: DISTAL BICEPS TENDON REPAIR: SHX1461

## 2019-05-28 LAB — NOVEL CORONAVIRUS, NAA: SARS-CoV-2, NAA: NOT DETECTED

## 2019-05-28 SURGERY — REPAIR, TENDON, BICEPS, DISTAL
Anesthesia: Monitor Anesthesia Care | Site: Arm Lower | Laterality: Left

## 2019-05-28 MED ORDER — MIDAZOLAM HCL 2 MG/2ML IJ SOLN
INTRAMUSCULAR | Status: AC
  Filled 2019-05-28: qty 2

## 2019-05-28 MED ORDER — LACTATED RINGERS IV SOLN: INTRAVENOUS | Status: DC

## 2019-05-28 MED ORDER — BUPIVACAINE LIPOSOME 1.3 % IJ SUSP
INTRAMUSCULAR | Status: DC | PRN
  Administered 2019-05-28: 10 mL via PERINEURAL

## 2019-05-28 MED ORDER — MEPERIDINE HCL 25 MG/ML IJ SOLN: 6.2500 mg | INTRAMUSCULAR | Status: DC | PRN

## 2019-05-28 MED ORDER — BUPIVACAINE-EPINEPHRINE (PF) 0.5% -1:200000 IJ SOLN
INTRAMUSCULAR | Status: DC | PRN
  Administered 2019-05-28: 15 mL via PERINEURAL

## 2019-05-28 MED ORDER — FENTANYL CITRATE (PF) 100 MCG/2ML IJ SOLN
INTRAMUSCULAR | Status: AC
  Filled 2019-05-28: qty 2

## 2019-05-28 MED ORDER — PROPOFOL 10 MG/ML IV BOLUS
INTRAVENOUS | Status: DC | PRN
  Administered 2019-05-28 (×4): 20 mg via INTRAVENOUS

## 2019-05-28 MED ORDER — ONDANSETRON HCL 4 MG/2ML IJ SOLN
INTRAMUSCULAR | Status: DC | PRN
  Administered 2019-05-28: 4 mg via INTRAVENOUS

## 2019-05-28 MED ORDER — METHOCARBAMOL 500 MG PO TABS: 500.0000 mg | ORAL_TABLET | Freq: Four times a day (QID) | ORAL | 2 refills | Status: DC | PRN

## 2019-05-28 MED ORDER — FENTANYL CITRATE (PF) 100 MCG/2ML IJ SOLN
25.0000 ug | INTRAMUSCULAR | Status: DC | PRN
  Administered 2019-05-28: 25 ug via INTRAVENOUS
  Administered 2019-05-28: 50 ug via INTRAVENOUS
  Administered 2019-05-28: 25 ug via INTRAVENOUS

## 2019-05-28 MED ORDER — OXYCODONE-ACETAMINOPHEN 5-325 MG PO TABS: 1.0000 | ORAL_TABLET | Freq: Three times a day (TID) | ORAL | 0 refills | Status: DC | PRN

## 2019-05-28 MED ORDER — SCOPOLAMINE 1 MG/3DAYS TD PT72
1.0000 | MEDICATED_PATCH | TRANSDERMAL | Status: DC
  Administered 2019-05-28: 1.5 mg via TRANSDERMAL

## 2019-05-28 MED ORDER — CLINDAMYCIN PHOSPHATE 900 MG/50ML IV SOLN
INTRAVENOUS | Status: AC
  Filled 2019-05-28: qty 50

## 2019-05-28 MED ORDER — OXYCODONE HCL 5 MG PO TABS: 5.0000 mg | ORAL_TABLET | Freq: Once | ORAL | Status: DC | PRN

## 2019-05-28 MED ORDER — MIDAZOLAM HCL 2 MG/2ML IJ SOLN
1.0000 mg | INTRAMUSCULAR | Status: DC | PRN
  Administered 2019-05-28: 2 mg via INTRAVENOUS

## 2019-05-28 MED ORDER — PROPOFOL 500 MG/50ML IV EMUL
INTRAVENOUS | Status: DC | PRN
  Administered 2019-05-28: 75 ug/kg/min via INTRAVENOUS

## 2019-05-28 MED ORDER — FENTANYL CITRATE (PF) 100 MCG/2ML IJ SOLN
50.0000 ug | INTRAMUSCULAR | Status: DC | PRN
  Administered 2019-05-28: 50 ug via INTRAVENOUS

## 2019-05-28 MED ORDER — PROPOFOL 500 MG/50ML IV EMUL
INTRAVENOUS | Status: AC
  Filled 2019-05-28: qty 50

## 2019-05-28 MED ORDER — CHLORHEXIDINE GLUCONATE 4 % EX LIQD: 60.0000 mL | Freq: Once | CUTANEOUS | Status: DC

## 2019-05-28 MED ORDER — OXYCODONE HCL 5 MG/5ML PO SOLN: 5.0000 mg | Freq: Once | ORAL | Status: DC | PRN

## 2019-05-28 MED ORDER — CLINDAMYCIN PHOSPHATE 900 MG/50ML IV SOLN
900.0000 mg | INTRAVENOUS | Status: AC
  Administered 2019-05-28: 900 mg via INTRAVENOUS

## 2019-05-28 MED ORDER — MIDAZOLAM HCL 5 MG/5ML IJ SOLN
INTRAMUSCULAR | Status: DC | PRN
  Administered 2019-05-28 (×2): 1 mg via INTRAVENOUS

## 2019-05-28 MED ORDER — LACTATED RINGERS IV SOLN
INTRAVENOUS | Status: DC
  Administered 2019-05-28 (×2): via INTRAVENOUS

## 2019-05-28 MED ORDER — ONDANSETRON HCL 4 MG PO TABS: 4.0000 mg | ORAL_TABLET | Freq: Three times a day (TID) | ORAL | 0 refills | Status: DC | PRN

## 2019-05-28 MED ORDER — PROMETHAZINE HCL 25 MG/ML IJ SOLN: 6.2500 mg | INTRAMUSCULAR | Status: DC | PRN

## 2019-05-28 SURGICAL SUPPLY — 75 items
ADH SKN CLS APL DERMABOND .7 (GAUZE/BANDAGES/DRESSINGS)
BLADE SURG 15 STRL LF DISP TIS (BLADE) ×2 IMPLANT
BLADE SURG 15 STRL SS (BLADE) ×6
BNDG CMPR 9X4 STRL LF SNTH (GAUZE/BANDAGES/DRESSINGS) ×1
BNDG ELASTIC 6X5.8 VLCR STR LF (GAUZE/BANDAGES/DRESSINGS) ×3 IMPLANT
BNDG ESMARK 4X9 LF (GAUZE/BANDAGES/DRESSINGS) ×3 IMPLANT
CORD BIPOLAR FORCEPS 12FT (ELECTRODE) ×3 IMPLANT
COVER BACK TABLE REUSABLE LG (DRAPES) ×3 IMPLANT
COVER WAND RF STERILE (DRAPES) IMPLANT
CUFF TOURN SGL QUICK 18X3 (MISCELLANEOUS) ×3 IMPLANT
CUFF TOURN SGL QUICK 18X4 (TOURNIQUET CUFF) ×2 IMPLANT
CUFF TOURN SGL QUICK 24 (TOURNIQUET CUFF)
CUFF TRNQT CYL 24X4X16.5-23 (TOURNIQUET CUFF) IMPLANT
DECANTER SPIKE VIAL GLASS SM (MISCELLANEOUS) IMPLANT
DERMABOND ADVANCED (GAUZE/BANDAGES/DRESSINGS)
DERMABOND ADVANCED .7 DNX12 (GAUZE/BANDAGES/DRESSINGS) IMPLANT
DRAPE C-ARM 42X72 X-RAY (DRAPES) ×3 IMPLANT
DRAPE EXTREMITY T 121X128X90 (DISPOSABLE) ×3 IMPLANT
DRAPE IMP U-DRAPE 54X76 (DRAPES) ×3 IMPLANT
DRAPE SURG 17X23 STRL (DRAPES) ×3 IMPLANT
DURAPREP 26ML APPLICATOR (WOUND CARE) ×1 IMPLANT
GAUZE SPONGE 4X4 12PLY STRL (GAUZE/BANDAGES/DRESSINGS) ×3 IMPLANT
GLOVE BIO SURGEON STRL SZ 6.5 (GLOVE) ×1 IMPLANT
GLOVE BIO SURGEONS STRL SZ 6.5 (GLOVE) ×1
GLOVE BIOGEL PI IND STRL 7.0 (GLOVE) ×1 IMPLANT
GLOVE BIOGEL PI INDICATOR 7.0 (GLOVE) ×6
GLOVE ECLIPSE 7.0 STRL STRAW (GLOVE) ×3 IMPLANT
GLOVE SKINSENSE NS SZ7.5 (GLOVE) ×2
GLOVE SKINSENSE STRL SZ7.5 (GLOVE) ×1 IMPLANT
GLOVE SURG SYN 7.5  E (GLOVE) ×2
GLOVE SURG SYN 7.5 E (GLOVE) ×1 IMPLANT
GLOVE SURG SYN 7.5 PF PI (GLOVE) ×1 IMPLANT
GOWN STRL REIN XL XLG (GOWN DISPOSABLE) ×3 IMPLANT
GOWN STRL REUS W/ TWL LRG LVL3 (GOWN DISPOSABLE) ×1 IMPLANT
GOWN STRL REUS W/ TWL XL LVL3 (GOWN DISPOSABLE) ×1 IMPLANT
GOWN STRL REUS W/TWL LRG LVL3 (GOWN DISPOSABLE) ×3
GOWN STRL REUS W/TWL XL LVL3 (GOWN DISPOSABLE) ×3
INSERTER BUTTON (SYSTAGENIX WOUND MANAGEMENT) IMPLANT
NDL PRECISIONGLIDE 27X1.5 (NEEDLE) IMPLANT
NDL SUT 6 .5 CRC .975X.05 MAYO (NEEDLE) ×1 IMPLANT
NEEDLE MAYO TAPER (NEEDLE) ×3
NEEDLE PRECISIONGLIDE 27X1.5 (NEEDLE) IMPLANT
NS IRRIG 1000ML POUR BTL (IV SOLUTION) ×3 IMPLANT
PACK BASIN DAY SURGERY FS (CUSTOM PROCEDURE TRAY) ×3 IMPLANT
PAD CAST 4YDX4 CTTN HI CHSV (CAST SUPPLIES) ×2 IMPLANT
PADDING CAST COTTON 4X4 STRL (CAST SUPPLIES) ×6
PADDING CAST SYNTHETIC 3 NS LF (CAST SUPPLIES) ×4
PADDING CAST SYNTHETIC 3X4 NS (CAST SUPPLIES) IMPLANT
PADDING CAST SYNTHETIC 4 (CAST SUPPLIES)
PADDING CAST SYNTHETIC 4X4 STR (CAST SUPPLIES) IMPLANT
PIN DRILL ACL TIGHTROPE 4MM (PIN) IMPLANT
SLEEVE SCD COMPRESS KNEE MED (MISCELLANEOUS) ×3 IMPLANT
SPLINT FIBERGLASS 4X30 (CAST SUPPLIES) ×1 IMPLANT
SPONGE LAP 18X18 RF (DISPOSABLE) ×3 IMPLANT
STOCKINETTE 4X48 STRL (DRAPES) ×3 IMPLANT
SUT ETHILON 3 0 PS 1 (SUTURE) ×2 IMPLANT
SUT FIBERWIRE #2 38 T-5 BLUE (SUTURE)
SUT MNCRL AB 4-0 PS2 18 (SUTURE) ×2 IMPLANT
SUT SILK 0 TIES 10X30 (SUTURE) IMPLANT
SUT VIC AB 2-0 SH 27 (SUTURE) ×3
SUT VIC AB 2-0 SH 27XBRD (SUTURE) IMPLANT
SUT VIC AB 3-0 PS1 18 (SUTURE)
SUT VIC AB 3-0 PS1 18XBRD (SUTURE) IMPLANT
SUT VICRYL AB 2 0 TIE (SUTURE) IMPLANT
SUT VICRYL AB 2 0 TIES (SUTURE) ×3
SUTURE FIBERWR #2 38 T-5 BLUE (SUTURE) IMPLANT
SUTURE LASSO SD WIRE LOOP 1.8 (Anchor) IMPLANT
SUTURE TAPE 1.3 FIBERLOP 20 ST (SUTURE) IMPLANT
SUTURELASSO SD WIRE LOOP 1.8 (Anchor) ×3 IMPLANT
SUTURETAPE 1.3 FIBERLOOP 20 ST (SUTURE)
SYR BULB 3OZ (MISCELLANEOUS) ×3 IMPLANT
SYR CONTROL 10ML LL (SYRINGE) IMPLANT
SYSTEM ARTHRO FOR DISTAL BICEP (Orthopedic Implant) ×2 IMPLANT
TOWEL GREEN STERILE FF (TOWEL DISPOSABLE) ×6 IMPLANT
UNDERPAD 30X36 HEAVY ABSORB (UNDERPADS AND DIAPERS) ×3 IMPLANT

## 2019-05-28 NOTE — Anesthesia Postprocedure Evaluation (Signed)
Anesthesia Post Note  Patient: Jerry Owens  Procedure(s) Performed: LEFT DISTAL BICEPS REPAIR (Left Arm Lower)     Patient location during evaluation: PACU Anesthesia Type: Regional Level of consciousness: awake and alert and oriented Pain management: pain level controlled Vital Signs Assessment: post-procedure vital signs reviewed and stable Respiratory status: spontaneous breathing, nonlabored ventilation and respiratory function stable Cardiovascular status: blood pressure returned to baseline and stable Postop Assessment: no apparent nausea or vomiting Anesthetic complications: no    Last Vitals:  Vitals:   05/28/19 1130 05/28/19 1145  BP: (!) 174/90 (!) 146/85  Pulse: (!) 52 (!) 53  Resp: 13 10  Temp:    SpO2: 100% 94%    Last Pain:  Vitals:   05/28/19 1145  TempSrc:   PainSc: 4                  Kayton Dunaj A.

## 2019-05-28 NOTE — Progress Notes (Signed)
Assisted Dr. Foster with left, ultrasound guided, interscalene  block. Side rails up, monitors on throughout procedure. See vital signs in flow sheet. Tolerated Procedure well.  

## 2019-05-28 NOTE — H&P (Signed)
PREOPERATIVE H&P  Chief Complaint: left distal biceps rupture  HPI: Jerry Owens is a 43 y.o. male who presents for surgical treatment of left distal biceps rupture.  He denies any changes in medical history.  Past Medical History:  Diagnosis Date  . Complication of anesthesia    hard to wake up  . Hypothyroidism   . PONV (postoperative nausea and vomiting)   . Rupture of right distal biceps tendon    Past Surgical History:  Procedure Laterality Date  . COLONOSCOPY WITH PROPOFOL N/A 12/22/2015   Procedure: COLONOSCOPY WITH PROPOFOL;  Surgeon: Garlan Fair, MD;  Location: [facility];  Service: Endoscopy;  Laterality: N/A;  . DISTAL BICEPS TENDON REPAIR Right 05/18/2017   Procedure: RIGHT DISTAL BICEPS REPAIR;  Surgeon: Leandrew Koyanagi, MD;  Location: [place];  Service: Orthopedics;  Laterality: Right;  . SHOULDER SURGERY     left rotator cuff surgery   Social History   Socioeconomic History  . Marital status: Married    Spouse name: Not on file  . Number of children: 2  . Years of education: Not on file  . Highest education level: Not on file  Occupational History  . Not on file  Social Needs  . Financial resource strain: Not on file  . Food insecurity    Worry: Not on file    Inability: Not on file  . Transportation needs    Medical: Not on file    Non-medical: Not on file  Tobacco Use  . Smoking status: Former Smoker    Types: Cigarettes    Quit date: 11/21/2004    Years since quitting: 14.5  . Smokeless tobacco: Never Used  Substance and Sexual Activity  . Alcohol use: Yes    Comment: social  . Drug use: No  . Sexual activity: Yes  Lifestyle  . Physical activity    Days per week: Not on file    Minutes per session: Not on file  . Stress: Not on file  Relationships  . Social Herbalist on phone: Not on file    Gets together: Not on file    Attends religious service: Not on file    Active member of club or  organization: Not on file    Attends meetings of clubs or organizations: Not on file    Relationship status: Not on file  Other Topics Concern  . Not on file  Social History Narrative  . Not on file   Family History  Problem Relation Age of Onset  . Cancer Mother   . Heart disease Father   . Cancer Paternal Grandmother   . Stroke Paternal Grandmother    Allergies  Allergen Reactions  . Amoxicillin Anaphylaxis    Swelling in face, neck, ears, and chest  . Penicillins Anaphylaxis    facial and neck swelling   Prior to Admission medications   Medication Sig Start Date End Date Taking? Authorizing Provider  Ascorbic Acid (VITAMIN C) 100 MG tablet Take 100 mg by mouth daily.   Yes [provider]  finasteride (PROSCAR) 5 MG tablet Take 1 tablet by mouth once daily 04/05/19  Yes Vivi Barrack, MD  levothyroxine (SYNTHROID) 100 MCG tablet Take 1 tablet (100 mcg total) by mouth daily. 02/23/19  Yes Vivi Barrack, MD  citalopram (CELEXA) 20 MG tablet Take 1 tablet (20 mg total) by mouth daily. 02/22/19   Vivi Barrack, MD  Positive ROS: All other systems have been reviewed and were otherwise negative with the exception of those mentioned in the HPI and as above.  Physical Exam: General: Alert, no acute distress Cardiovascular: No pedal edema Respiratory: No cyanosis, no use of accessory musculature GI: abdomen soft Skin: No lesions in the area of chief complaint Neurologic: Sensation intact distally Psychiatric: Patient is competent for consent with normal mood and affect Lymphatic: no lymphedema  MUSCULOSKELETAL: exam stable  Assessment: left distal biceps rupture  Plan: Plan for Procedure(s): LEFT DISTAL BICEPS REPAIR  The risks benefits and alternatives were discussed with the patient including but not limited to the risks of nonoperative treatment, versus surgical intervention including infection, bleeding, nerve injury,  blood clots, cardiopulmonary  complications, morbidity, mortality, among others, and they were willing to proceed.   Glee Arvin, MD   05/28/2019 7:22 AM

## 2019-05-28 NOTE — Op Note (Signed)
   Date of Surgery: 05/28/2019  INDICATIONS: Jerry Owens is a 43 y.o.-year-old male with a left distal biceps rupture;  The patient did consent to the procedure after discussion of the risks and benefits.  PREOPERATIVE DIAGNOSIS: Left distal biceps rupture  POSTOPERATIVE DIAGNOSIS: Same.  PROCEDURE: Reinsertion of left distal biceps tendon.  CPT [phone]  SURGEON: Jerry Owens, M.D.  ASSIST: Ciro Backer [place], [place]; necessary for the timely completion of procedure and due to complexity of procedure.  ANESTHESIA:  Interscalene and MAC  IV FLUIDS AND URINE: See anesthesia.  ESTIMATED BLOOD LOSS: minimal mL.  IMPLANTS: Arthrex   DRAINS: none  COMPLICATIONS: see description of procedure.  DESCRIPTION OF PROCEDURE: The patient was brought to the operating room and placed supine on the operating table.  The patient had been signed prior to the procedure and this was documented. The patient had the anesthesia placed by the anesthesiologist.  A time-out was performed to confirm that this was the correct patient, site, side and location. The patient did receive antibiotics prior to the incision and was re-dosed during the procedure as needed at indicated intervals.  A tourniquet was placed on the upper arm.  The patient had the operative extremity prepped and draped in the standard surgical fashion.    A longitudinal incision was made on the volar aspect of the proximal forearm between the interval of her brachial radialis and pronator teres.  Dissection was carried down through the subcutaneous tissue.  Venous branches were either cauterized or ligated and then mobilized.  The LABC nerve was also identified and mobilized.  We then found the distal biceps tendon and traced this down deep onto the radial tuberosity.  A seroma was encountered consistent with a full rupture.  The tendon was then pulled out of the surgical wound and the end of the distal biceps was prepared and then whipstitched.  We  then bluntly dissected down onto the radial tuberosity and retractors were placed with the forearm in full supination.  A K wire was then used to localize the repair site which was confirmed under fluoroscopy.  The drill was then drilled bicortically across the radial tuberosity.  The reamer was then drilled over the drill across the near cortex.  The sutures were then placed through the flip button and it was then delivered across the bony tunnel and flipped on the far cortex which was then confirmed under fluoroscopy.  The elbow was then placed in flexion and the distal biceps was delivered into the bony tunnel.  An additional interference screw was then placed in the bony tunnel for added fixation.  The free ends of the suture were then passed through the distal end of the biceps and tied down.  The surgical wound was then thoroughly irrigated and a layer closure was performed.  Sterile dressings were applied.  Elbow was placed in a 90 degree splint.  Patient tolerated the procedure well had no immediate complications.  POSTOPERATIVE PLAN: Discharge home and follow-up in 1 week for wound check and initiation of range of motion  N. Eduard Roux, MD 10:46 AM

## 2019-05-28 NOTE — Anesthesia Procedure Notes (Signed)
Anesthesia Regional Block: Interscalene brachial plexus block   Pre-Anesthetic Checklist: ,, timeout performed, Correct Patient, Correct Site, Correct Laterality, Correct Procedure, Correct Position, site marked, Risks and benefits discussed,  Surgical consent,  Pre-op evaluation,  At surgeon's request and post-op pain management  Laterality: Left  Prep: chloraprep       Needles:  Injection technique: Single-shot  Needle Type: Echogenic Stimulator Needle     Needle Length: 9cm  Needle Gauge: 21   Needle insertion depth: 6 cm   Additional Needles:   Procedures:,,,, ultrasound used (permanent image in chart),,,,  Motor weakness within 7 minutes.  Narrative:  Start time: 05/28/2019 8:18 AM End time: 05/28/2019 8:23 AM Injection made incrementally with aspirations every 5 mL.  Performed by: Personally  Anesthesiologist: Josephine Igo, MD  Additional Notes: Timeout performed. Patient sedated. Relevant anatomy ID'd using [place]. Incremental 2-6ml injection of LA with frequent aspiration. Patient tolerated procedure well.        Left Interscalene Block

## 2019-05-28 NOTE — Discharge Instructions (Signed)
Postoperative instructions:  Weightbearing instructions: non weight bearing  Dressing instructions: Keep your dressing and/or splint clean and dry at all times.  It will be removed at your first post-operative appointment.  Your stitches and/or staples will be removed at this visit.  Incision instructions:  Do not soak your incision for 3 weeks after surgery.  If the incision gets wet, pat dry and do not scrub the incision.  Pain control:  You have been given a prescription to be taken as directed for post-operative pain control.  In addition, elevate the operative extremity above the heart at all times to prevent swelling and throbbing pain.  Take over-the-counter Colace, 100mg  by mouth twice a day while taking narcotic pain medications to help prevent constipation.  Follow up appointments: 1) 7 days for wound check. 2) Dr. as scheduled.   -------------------------------------------------------------------------------------------------------------  After Surgery Pain Control:  After your surgery, post-surgical discomfort or pain is likely. This discomfort can last several days to a few weeks. At certain times of the day your discomfort may be more intense.  Did you receive a nerve block?  A nerve block can provide pain relief for one hour to two days after your surgery. As long as the nerve block is working, you will experience little or no sensation in the area the surgeon operated on.  As the nerve block wears off, you will begin to experience pain or discomfort. It is very important that you begin taking your prescribed pain medication before the nerve block fully wears off. Treating your pain at the first sign of the block wearing off will ensure your pain is better controlled and more tolerable when full-sensation returns. Do not wait until the pain is intolerable, as the medicine will be less effective. It is better to treat pain in advance than to try and catch up.  General  Anesthesia:  If you did not receive a nerve block during your surgery, you will need to start taking your pain medication shortly after your surgery and should continue to do so as prescribed by your surgeon.  Pain Medication:  Most commonly we prescribe Vicodin and Percocet for post-operative pain. Both of these medications contain a combination of acetaminophen (Tylenol) and a narcotic to help control pain.   It takes between 30 and 45 minutes before pain medication starts to work. It is important to take your medication before your pain level gets too intense.   Nausea is a common side effect of many pain medications. You will want to eat something before taking your pain medicine to help prevent nausea.   If you are taking a prescription pain medication that contains acetaminophen, we recommend that you do not take additional over the counter acetaminophen (Tylenol).  Other pain relieving options:   Using a cold pack to ice the affected area a few times a day (15 to 20 minutes at a time) can help to relieve pain, reduce swelling and bruising.   Elevation of the affected area can also help to reduce pain and swelling.    Post Anesthesia Home Care Instructions  Activity: Get plenty of rest for the remainder of the day. A responsible individual must stay with you for 24 hours following the procedure.  For the next 24 hours, DO NOT: -Drive a car -Roda Shutters -Drink alcoholic beverages -Take any medication unless instructed by your physician -Make any legal decisions or sign important papers.  Meals: Start with liquid foods such as gelatin or soup. Progress  to regular foods as tolerated. Avoid greasy, spicy, heavy foods. If nausea and/or vomiting occur, drink only clear liquids until the nausea and/or vomiting subsides. Call your physician if vomiting continues.  Special Instructions/Symptoms: Your throat may feel dry or sore from the anesthesia or the breathing tube placed in  your throat during surgery. If this causes discomfort, gargle with warm salt water. The discomfort should disappear within 24 hours.  If you had a scopolamine patch placed behind your ear for the management of post- operative nausea and/or vomiting:  1. The medication in the patch is effective for 72 hours, after which it should be removed.  Wrap patch in a tissue and discard in the trash. Wash hands thoroughly with soap and water. 2. You may remove the patch earlier than 72 hours if you experience unpleasant side effects which may include dry mouth, dizziness or visual disturbances. 3. Avoid touching the patch. Wash your hands with soap and water after contact with the patch.      Regional Anesthesia Blocks  1. Numbness or the inability to move the "blocked" extremity may last from 3-48 hours after placement. The length of time depends on the medication injected and your individual response to the medication. If the numbness is not going away after 48 hours, call your surgeon.  2. The extremity that is blocked will need to be protected until the numbness is gone and the  Strength has returned. Because you cannot feel it, you will need to take extra care to avoid injury. Because it may be weak, you may have difficulty moving it or using it. You may not know what position it is in without looking at it while the block is in effect.  3. For blocks in the legs and feet, returning to weight bearing and walking needs to be done carefully. You will need to wait until the numbness is entirely gone and the strength has returned. You should be able to move your leg and foot normally before you try and bear weight or walk. You will need someone to be with you when you first try to ensure you do not fall and possibly risk injury.  4. Bruising and tenderness at the needle site are common side effects and will resolve in a few days.  5. Persistent numbness or new problems with movement should be communicated  to the surgeon or the [facility] [phone]).   Information for Discharge Teaching: EXPAREL (bupivacaine liposome injectable suspension)   Your surgeon or anesthesiologist gave you EXPAREL(bupivacaine) to help control your pain after surgery.   EXPAREL is a local anesthetic that provides pain relief by numbing the tissue around the surgical site.  EXPAREL is designed to release pain medication over time and can control pain for up to 72 hours.  Depending on how you respond to EXPAREL, you may require less pain medication during your recovery.  Possible side effects:  Temporary loss of sensation or ability to move in the area where bupivacaine was injected.  Nausea, vomiting, constipation  Rarely, numbness and tingling in your mouth or lips, lightheadedness, or anxiety may occur.  Call your doctor right away if you think you may be experiencing any of these sensations, or if you have other questions regarding possible side effects.  Follow all other discharge instructions given to you by your surgeon or nurse. Eat a healthy diet and drink plenty of water or other fluids.  If you return to the  hospital for any reason within 96 hours following the administration of EXPAREL, it is important for health care providers to know that you have received this anesthetic. A teal colored band has been placed on your arm with the date, time and amount of EXPAREL you have received in order to alert and inform your health care providers. Please leave this armband in place for the full 96 hours following administration, and then you may remove the band.

## 2019-05-28 NOTE — Transfer of Care (Signed)
Immediate Anesthesia Transfer of Care Note  Patient: Jerry Owens  Procedure(s) Performed: LEFT DISTAL BICEPS REPAIR (Left Arm Lower)  Patient Location: PACU  Anesthesia Type:MAC combined with regional for post-op pain  Level of Consciousness: awake, alert  and oriented  Airway & Oxygen Therapy: Patient Spontanous Breathing and Patient connected to face mask oxygen  Post-op Assessment: Report given to RN and Post -op Vital signs reviewed and stable  Post vital signs: Reviewed and stable  Last Vitals:  Vitals Value Taken Time  BP    Temp    Pulse 88 05/28/19 1111  Resp    SpO2 97 % 05/28/19 1111  Vitals shown include unvalidated device data.  Last Pain:  Vitals:   05/28/19 0752  TempSrc: Oral  PainSc: 0-No pain         Complications: No apparent anesthesia complications

## 2019-05-28 NOTE — Anesthesia Preprocedure Evaluation (Signed)
Anesthesia Evaluation  Patient identified by MRN, date of birth, ID band Patient awake    Reviewed: Allergy & Precautions, NPO status , Patient's Chart, lab work & pertinent test results  History of Anesthesia Complications (+) PONV and history of anesthetic complications  Airway Mallampati: II  TM Distance: >3 FB Neck ROM: Full    Dental no notable dental hx. (+) Teeth Intact   Pulmonary former smoker,    Pulmonary exam normal breath sounds clear to auscultation       Cardiovascular negative cardio ROS Normal cardiovascular exam Rhythm:Regular Rate:Normal     Neuro/Psych negative neurological ROS  negative psych ROS   GI/Hepatic negative GI ROS, Neg liver ROS,   Endo/Other  Hypothyroidism Hyperlipidemia  Renal/GU negative Renal ROS  negative genitourinary   Musculoskeletal Ruptured left distal biceps tendon   Abdominal   Peds  Hematology   Anesthesia Other Findings   Reproductive/Obstetrics                             Anesthesia Physical Anesthesia Plan  ASA: II  Anesthesia Plan: Regional and MAC   Post-op Pain Management:    Induction: Intravenous  PONV Risk Score and Plan: Ondansetron, Propofol infusion, Treatment may vary due to age or medical condition, Scopolamine patch - Pre-op and Midazolam  Airway Management Planned: Natural Airway, Nasal Cannula and Simple Face Mask  Additional Equipment:   Intra-op Plan:   Post-operative Plan:   Informed Consent: I have reviewed the patients History and Physical, chart, labs and discussed the procedure including the risks, benefits and alternatives for the proposed anesthesia with the patient or authorized representative who has indicated his/her understanding and acceptance.     Dental advisory given  Plan Discussed with: CRNA and Surgeon  Anesthesia Plan Comments:         Anesthesia Quick Evaluation

## 2019-06-01 ENCOUNTER — Encounter (HOSPITAL_BASED_OUTPATIENT_CLINIC_OR_DEPARTMENT_OTHER): Payer: Self-pay | Admitting: Orthopaedic Surgery

## 2019-06-05 ENCOUNTER — Other Ambulatory Visit: Payer: Self-pay

## 2019-06-05 ENCOUNTER — Encounter: Payer: Self-pay | Admitting: Orthopaedic Surgery

## 2019-06-05 ENCOUNTER — Ambulatory Visit (INDEPENDENT_AMBULATORY_CARE_PROVIDER_SITE_OTHER): Payer: 59 | Admitting: Physician Assistant

## 2019-06-05 DIAGNOSIS — S46212D Strain of muscle, fascia and tendon of other parts of biceps, left arm, subsequent encounter: Secondary | ICD-10-CM

## 2019-06-05 NOTE — Progress Notes (Signed)
   Post-Op Visit Note   Patient: Jerry Owens           Date of Birth: May 20, 1976           MRN: [id number] Visit Date: 06/05/2019 PCP: Vivi Barrack, MD   Assessment & Plan:  Chief Complaint:  Chief Complaint  Patient presents with  . Left Elbow - Pain   Visit Diagnoses:  1. Rupture of left distal biceps tendon, subsequent encounter     Plan: Patient is a pleasant 43 year old gentleman who presents our clinic today 1 week status post left distal biceps repair.  He has been doing well.  No fevers or chills.  Mild to moderate pain which is alleviated with ibuprofen.  Examination of his left forearm reveals a well-healing surgical incision without evidence of infection or cellulitis.  He has full range of motion of his fingers.  No nerve palsy.  At this point, we will apply Steri-Strips.  He will continue to be nonweight bearing for the next 5 weeks.  He may begin range of motion of the wrist, elbow and shoulder.  Follow-up with [place] in 5 weeks time for recheck.  Follow-Up Instructions: Return in about 5 weeks (around 07/10/2019).   Orders:  No orders of the defined types were placed in this encounter.  No orders of the defined types were placed in this encounter.   Imaging: No new imaging  PMFS History: Patient Active Problem List   Diagnosis Date Noted  . Rupture of left distal biceps tendon 05/23/2019  . Dyslipidemia 11/23/2017  . Hypothyroidism 07/28/2017  . 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 Rupture of right distal biceps tendon 05/18/2017  . Impingement syndrome of right shoulder 05/20/2016   Past Medical History:  Diagnosis Date  . Complication of anesthesia    hard to wake up  . Hypothyroidism   . PONV (postoperative nausea and vomiting)   . Rupture of right distal biceps tendon     Family History  Problem Relation Age of Onset  . Cancer Mother   . Heart disease Father   . Cancer Paternal Grandmother   . Stroke Paternal Grandmother     Past Surgical  History:  Procedure Laterality Date  . COLONOSCOPY WITH PROPOFOL N/A 12/22/2015   Procedure: COLONOSCOPY WITH PROPOFOL;  Surgeon: Garlan Fair, MD;  Location: [facility];  Service: Endoscopy;  Laterality: N/A;  . DISTAL BICEPS TENDON REPAIR Right 05/18/2017   Procedure: RIGHT DISTAL BICEPS REPAIR;  Surgeon: Leandrew Koyanagi, MD;  Location: [place];  Service: Orthopedics;  Laterality: Right;  . DISTAL BICEPS TENDON REPAIR Left 05/28/2019   Procedure: LEFT DISTAL BICEPS REPAIR;  Surgeon: Leandrew Koyanagi, MD;  Location: [place];  Service: Orthopedics;  Laterality: Left;  . SHOULDER SURGERY     left rotator cuff surgery   Social History   Occupational History  . Not on file  Tobacco Use  . Smoking status: Former Smoker    Types: Cigarettes    Quit date: 11/21/2004    Years since quitting: 14.5  . Smokeless tobacco: Never Used  Substance and Sexual Activity  . Alcohol use: Yes    Comment: social  . Drug use: No  . Sexual activity: Yes

## 2019-06-15 ENCOUNTER — Telehealth: Payer: Self-pay | Admitting: Orthopaedic Surgery

## 2019-06-15 NOTE — Telephone Encounter (Signed)
I called patient. He states that we were supposed to receive form from [employer] to complete due to extension of FMLA from original documentation. This is what he needs completed and faxed back. I explained that I would have to see if I can find form, and that it may have already gone to [employer] to complete.  I advised patient we would call when we knew that we had received form.

## 2019-06-15 NOTE — Telephone Encounter (Signed)
Sending to both of you as I could not find form and was unsure if it had gone to [employer]. Patient states that [employer] told him they have faxed this form twice.  Please let patient know once we have received it.

## 2019-06-15 NOTE — Telephone Encounter (Signed)
Patient came by and asked about a letter that was supposed to be faxed to [facility].  Please call patient to advise on this letter.  [phone]

## 2019-06-18 NOTE — Telephone Encounter (Signed)
I looked through my faxes. I do not see anything. Let me know if you have any forms on him please.

## 2019-06-18 NOTE — Telephone Encounter (Signed)
I called pt

## 2019-07-10 ENCOUNTER — Other Ambulatory Visit: Payer: Self-pay

## 2019-07-10 ENCOUNTER — Encounter: Payer: Self-pay | Admitting: Orthopaedic Surgery

## 2019-07-10 ENCOUNTER — Ambulatory Visit (INDEPENDENT_AMBULATORY_CARE_PROVIDER_SITE_OTHER): Payer: Managed Care, Other (non HMO) | Admitting: Physician Assistant

## 2019-07-10 DIAGNOSIS — S46212D Strain of muscle, fascia and tendon of other parts of biceps, left arm, subsequent encounter: Secondary | ICD-10-CM

## 2019-07-10 NOTE — Progress Notes (Signed)
Post-Op Visit Note   Patient: Jerry Owens           Date of Birth: [birth year]           MRN: [id number] Visit Date: 07/10/2019 PCP: Ardith Dark, MD   Assessment & Plan:  Chief Complaint:  Chief Complaint  Patient presents with  . Left Elbow - Follow-up, Pain   Visit Diagnoses:  1. Rupture of left distal biceps tendon, subsequent encounter     Plan: Patient is a pleasant 44 year old gentleman who presents to our clinic today 6 weeks status post left distal biceps repair.  He has been doing well.  He has been working on shoulder, elbow and wrist range of motion.  He notes decreased sensation to the volar forearm.  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  Examination of his left elbow reveals a well-healing surgical incision without evidence of infection or cellulitis.  He has near full range of motion of the elbow, wrist and fingers.  He does have decreased sensation to the volar forearm.  Fingers are warm and well-perfused.  At this point, we will start him in formal physical therapy to work on strengthening exercises.  We will limit his weightbearing to 10-15 pounds and start him in PT.  Follow up in 6 weeks for recheck and likely release to work full duty.  Follow-Up Instructions: Return in about 6 weeks (around 08/21/2019).   Orders:  No orders of the defined types were placed in this encounter.  No orders of the defined types were placed in this encounter.   Imaging: No new imaging   PMFS History: Patient Active Problem List   Diagnosis Date Noted  . Rupture of left distal biceps tendon 05/23/2019  . Dyslipidemia 11/23/2017  . Hypothyroidism 07/28/2017  . 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 Rupture of right distal biceps tendon 05/18/2017  . Impingement syndrome of right shoulder 05/20/2016   Past Medical History:  Diagnosis Date  . Complication of anesthesia    hard to wake up  . Hypothyroidism   . PONV (postoperative nausea and vomiting)     . Rupture of right distal biceps tendon     Family History  Problem Relation Age of Onset  . Cancer Mother   . Heart disease Father   . Cancer Paternal Grandmother   . Stroke Paternal Grandmother     Past Surgical History:  Procedure Laterality Date  . COLONOSCOPY WITH PROPOFOL N/A 12/22/2015   Procedure: COLONOSCOPY WITH PROPOFOL;  Surgeon: Charolett Bumpers, MD;  Location: [facility];  Service: Endoscopy;  Laterality: N/A;  . DISTAL BICEPS TENDON REPAIR Right 05/18/2017   Procedure: RIGHT DISTAL BICEPS REPAIR;  Surgeon: Tarry Kos, MD;  Location: [facility];  Service: Orthopedics;  Laterality: Right;  . DISTAL BICEPS TENDON REPAIR Left 05/28/2019   Procedure: LEFT DISTAL BICEPS REPAIR;  Surgeon: Tarry Kos, MD;  Location: [facility];  Service: Orthopedics;  Laterality: Left;  . SHOULDER SURGERY     left rotator cuff surgery   Social History   Occupational History  . Not on file  Tobacco Use  . Smoking status: Former Smoker    Types: Cigarettes    Quit date: 11/21/2004    Years since quitting: 14.6  . Smokeless tobacco: Never Used  Substance and Sexual Activity  . Alcohol use: Yes    Comment: social  . Drug use: No  . Sexual activity: Yes

## 2019-08-21 ENCOUNTER — Other Ambulatory Visit: Payer: Self-pay

## 2019-08-21 ENCOUNTER — Ambulatory Visit (INDEPENDENT_AMBULATORY_CARE_PROVIDER_SITE_OTHER): Payer: Managed Care, Other (non HMO) | Admitting: Orthopaedic Surgery

## 2019-08-21 ENCOUNTER — Encounter: Payer: Self-pay | Admitting: Orthopaedic Surgery

## 2019-08-21 DIAGNOSIS — S46212D Strain of muscle, fascia and tendon of other parts of biceps, left arm, subsequent encounter: Secondary | ICD-10-CM

## 2019-08-21 NOTE — Progress Notes (Signed)
   Post-Op Visit Note   Patient: Jerry Owens           Date of Birth: 15-Sep-1975           MRN: [id number] Visit Date: 08/21/2019 PCP: Ardith Dark, MD   Assessment & Plan:  Chief Complaint:  Chief Complaint  Patient presents with  . Left Elbow - Pain, Follow-up   Visit Diagnoses:  1. Rupture of left distal biceps tendon, subsequent encounter     Plan: Jerry Owens is 12 weeks status post left distal biceps repair.  He is doing well and reports no pain.  He has completed physical therapy.  He is hoping to return back to work at this point.  Surgical scar is fully healed.  Neurovascular intact.  He has excellent elbow flexion and forearm supination strength.  He has no pain with elbow range of motion.  At this point we will release him back to work without restrictions.  Follow-up as needed.  Follow-Up Instructions: Return if symptoms worsen or fail to improve.   Orders:  No orders of the defined types were placed in this encounter.  No orders of the defined types were placed in this encounter.   Imaging: No results found.  PMFS History: Patient Active Problem List   Diagnosis Date Noted  . Rupture of left distal biceps tendon 05/23/2019  . Dyslipidemia 11/23/2017  . Hypothyroidism 07/28/2017  . 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 Rupture of right distal biceps tendon 05/18/2017  . Impingement syndrome of right shoulder 05/20/2016   Past Medical History:  Diagnosis Date  . Complication of anesthesia    hard to wake up  . Hypothyroidism   . PONV (postoperative nausea and vomiting)   . Rupture of right distal biceps tendon     Family History  Problem Relation Age of Onset  . Cancer Mother   . Heart disease Father   . Cancer Paternal Grandmother   . Stroke Paternal Grandmother     Past Surgical History:  Procedure Laterality Date  . COLONOSCOPY WITH PROPOFOL N/A 12/22/2015   Procedure: COLONOSCOPY WITH PROPOFOL;  Surgeon: Charolett Bumpers, MD;  Location: [facility];  Service: Endoscopy;  Laterality: N/A;  . DISTAL BICEPS TENDON REPAIR Right 05/18/2017   Procedure: RIGHT DISTAL BICEPS REPAIR;  Surgeon: Tarry Kos, MD;  Location: [facility];  Service: Orthopedics;  Laterality: Right;  . DISTAL BICEPS TENDON REPAIR Left 05/28/2019   Procedure: LEFT DISTAL BICEPS REPAIR;  Surgeon: Tarry Kos, MD;  Location: [facility];  Service: Orthopedics;  Laterality: Left;  . SHOULDER SURGERY     left rotator cuff surgery   Social History   Occupational History  . Not on file  Tobacco Use  . Smoking status: Former Smoker    Types: Cigarettes    Quit date: 11/21/2004    Years since quitting: 14.7  . Smokeless tobacco: Never Used  Substance and Sexual Activity  . Alcohol use: Yes    Comment: social  . Drug use: No  . Sexual activity: Yes

## 2019-12-13 ENCOUNTER — Other Ambulatory Visit: Payer: Self-pay | Admitting: Family Medicine

## 2020-01-20 IMAGING — MR MR ELBOW*L* W/O CM
4 of 7 series · 21 of 40 positions shown · non-contrast
Comparison: Radiographs dated 05/08/2019

CLINICAL DATA: Left elbow pain since a pulling injury a few weeks
ago.

EXAM:
MRI OF THE LEFT ELBOW WITHOUT CONTRAST
TECHNIQUE: Multiplanar, multisequence MR imaging of the elbow was performed. No
intravenous contrast was administered.

[Series 5: T1 · axial · 3.5mm · 0.29mm/px · z∈[-57,+79]mm · 4 of 30 slices shown]
[im 1/30]
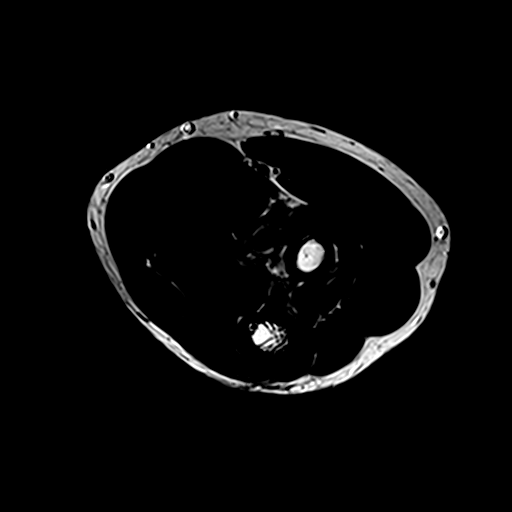
[im 6/30]
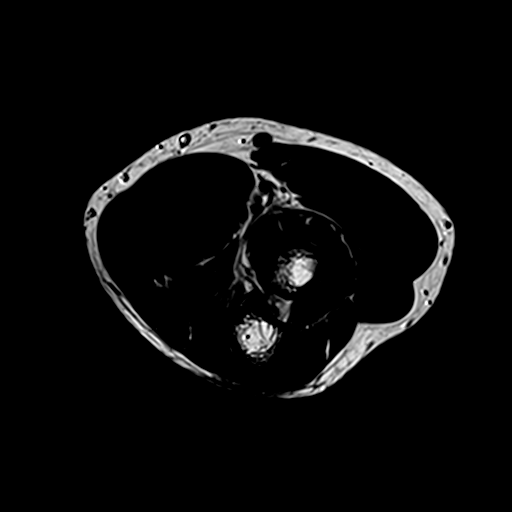
[im 18/30]
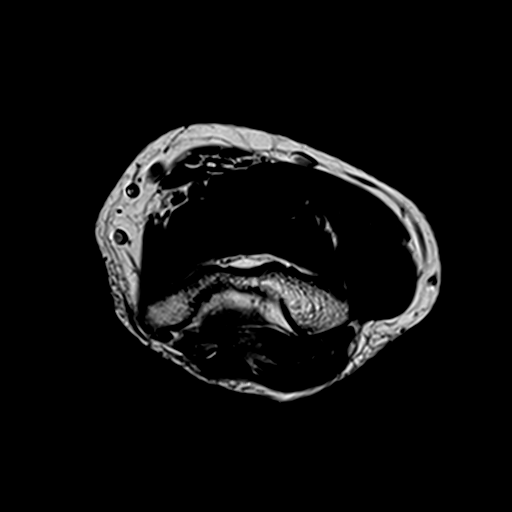
[im 30/30]
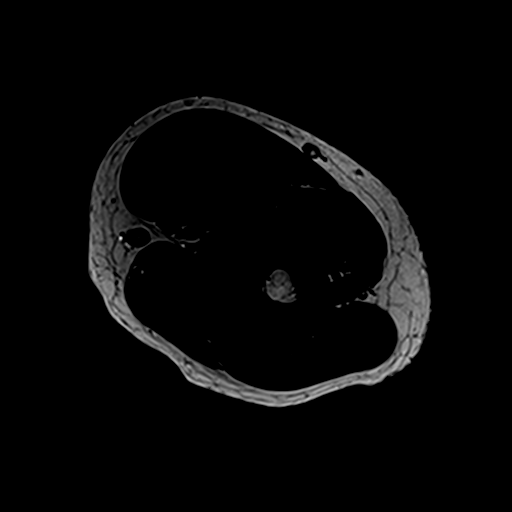

[Series 6: T2 fat-sat · axial · 3.5mm · 0.29mm/px · z∈[-57,+79]mm · 7 of 30 slices shown (1 of 2)]
[im 1/30]
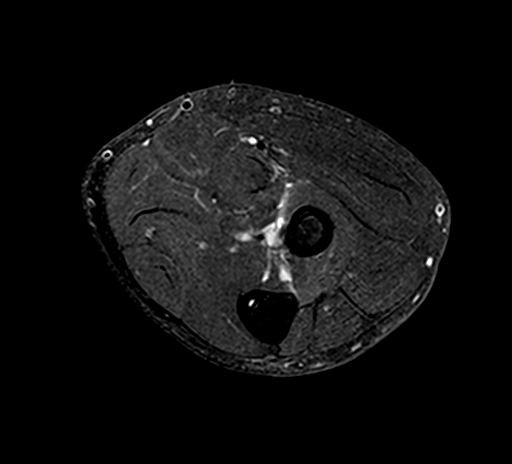
[im 5/30]
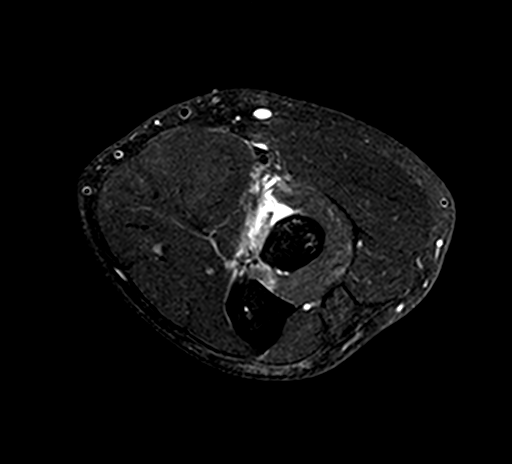
[im 10/30]
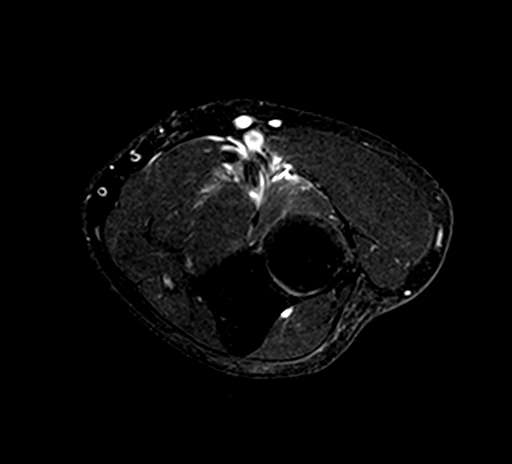
[im 15/30]
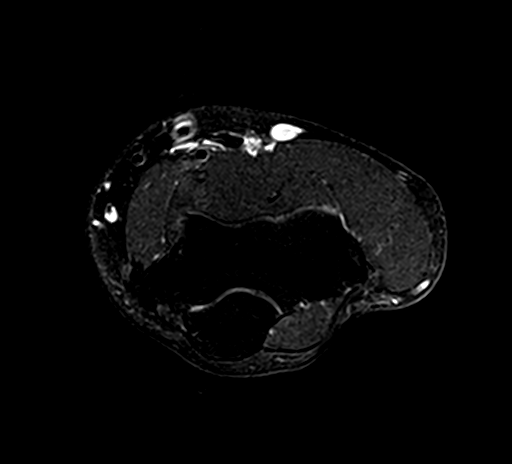
[im 20/30]
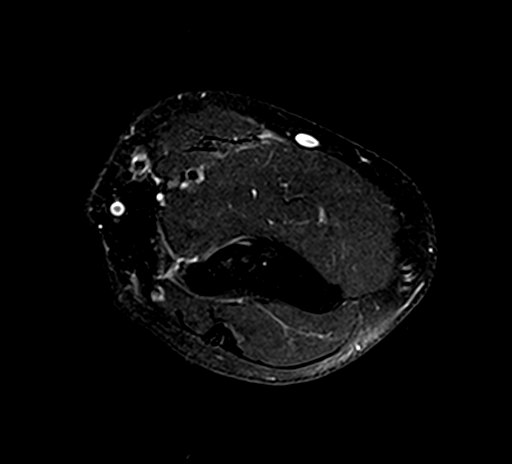
[im 25/30]
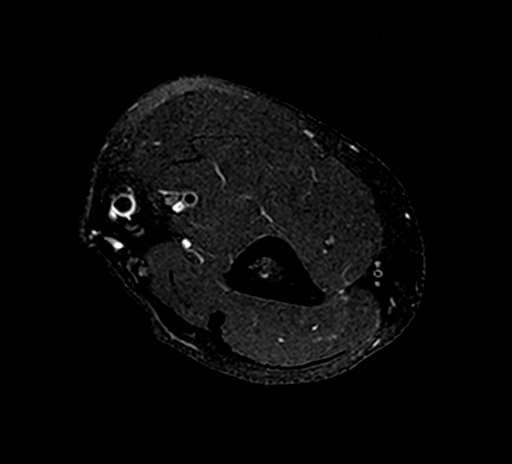
[im 30/30]
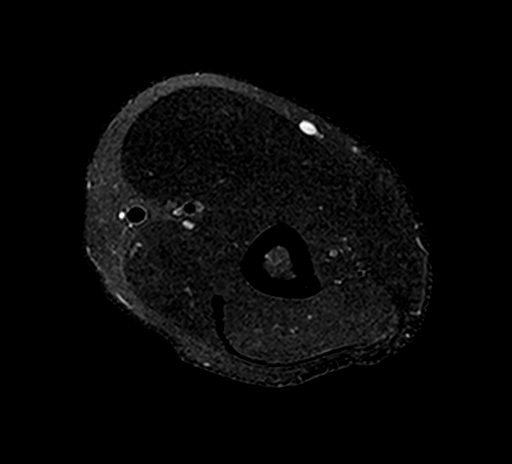

[Series 8: T2 fat-sat · coronal · 3.5mm · 0.31mm/px · 5 of 21 slices shown (2 of 2)]
[im 1/21]
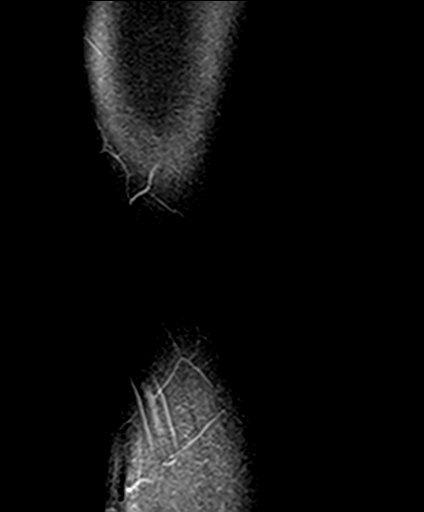
[im 6/21]
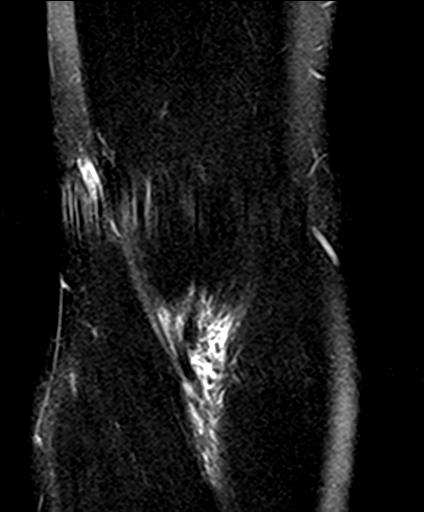
[im 11/21]
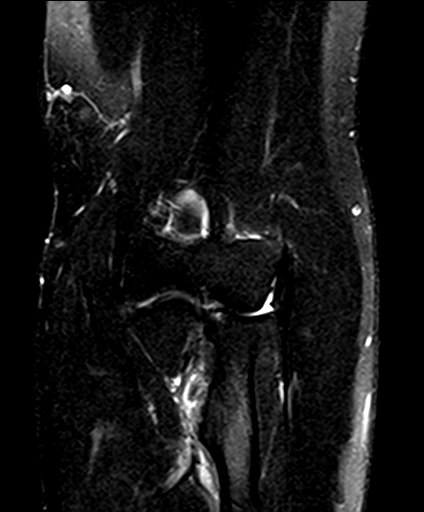
[im 16/21]
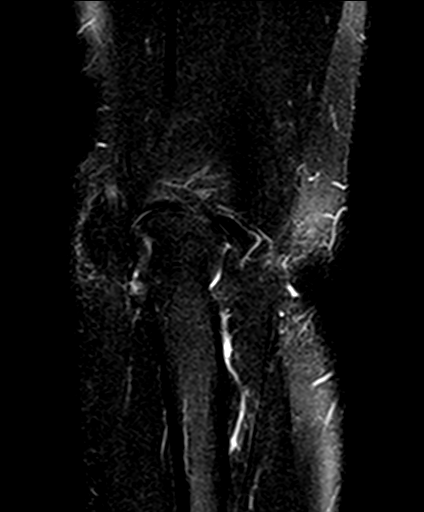
[im 21/21]
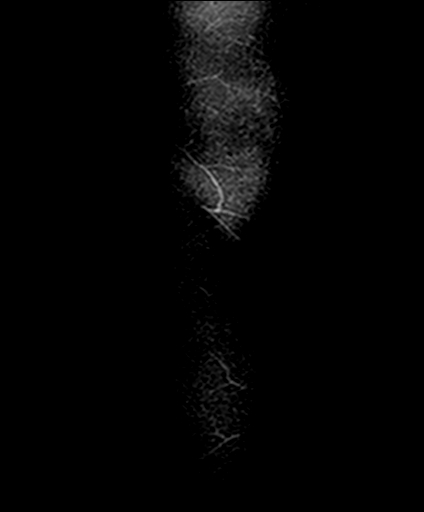

[Series 9: PD fat-sat · sagittal · 3.3mm · 0.31mm/px · 5 of 24 slices shown]
[im 1/24]
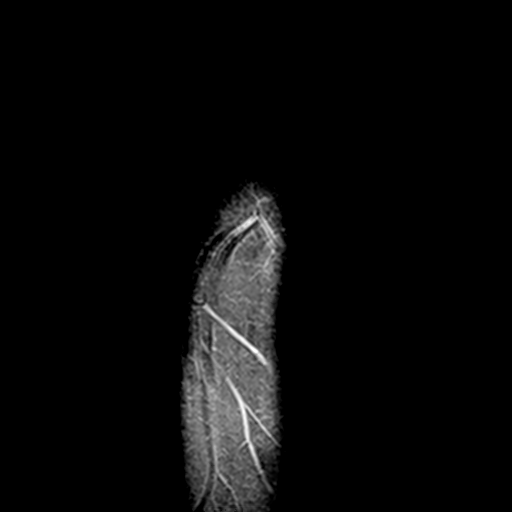
[im 6/24]
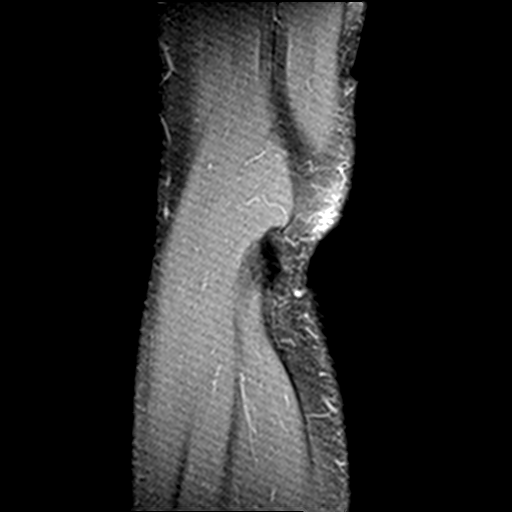
[im 12/24]
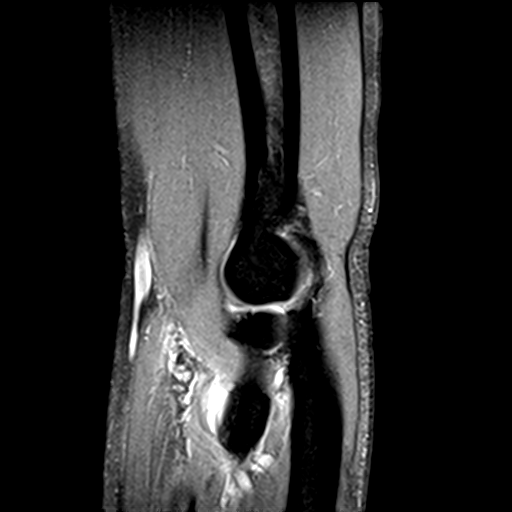
[im 18/24]
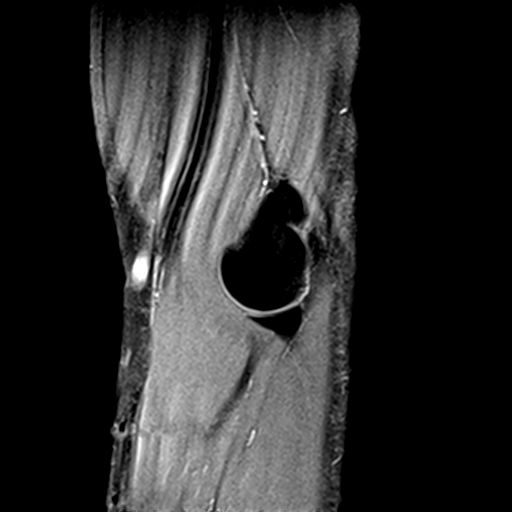
[im 24/24]
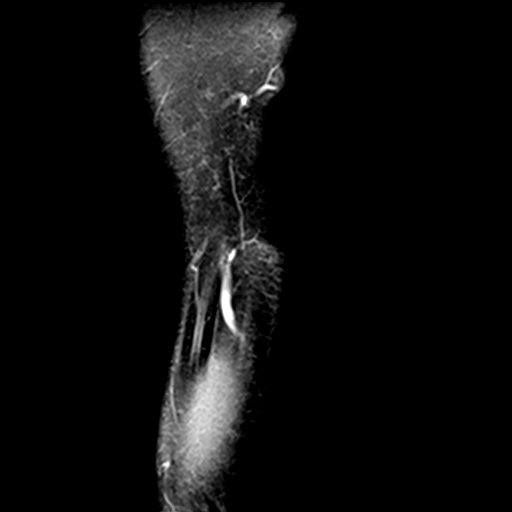

[21 of 40 positions shown; findings below may reference images not displayed]

FINDINGS: TENDONS

Common forearm flexor origin: Normal.

Common forearm extensor origin: Normal.

Biceps: There is complete rupture of distal biceps tendon. There is
only 1 cm of retraction. There is edema in and around the distal
stump of the tendon.

Triceps: Normal.

LIGAMENTS

Medial stabilizers: Normal.

Lateral stabilizers:  Normal.

Cartilage: Normal.

Joint: No effusion. No loose body.

Cubital tunnel: Normal.

Bones: Normal.
IMPRESSION: Complete rupture of the distal biceps tendon with only 1 cm of
retraction.

## 2020-02-26 ENCOUNTER — Other Ambulatory Visit: Payer: Self-pay

## 2020-02-26 ENCOUNTER — Ambulatory Visit (INDEPENDENT_AMBULATORY_CARE_PROVIDER_SITE_OTHER): Payer: 59 | Admitting: Family Medicine

## 2020-02-26 ENCOUNTER — Encounter: Payer: Self-pay | Admitting: Family Medicine

## 2020-02-26 VITALS — BP 124/78 | HR 47 | Temp 97.6°F | Ht 68.0 in | Wt 188.0 lb

## 2020-02-26 DIAGNOSIS — Z6828 Body mass index (BMI) 28.0-28.9, adult: Secondary | ICD-10-CM

## 2020-02-26 DIAGNOSIS — L649 Androgenic alopecia, unspecified: Secondary | ICD-10-CM | POA: Diagnosis not present

## 2020-02-26 DIAGNOSIS — E785 Hyperlipidemia, unspecified: Secondary | ICD-10-CM

## 2020-02-26 DIAGNOSIS — R059 Cough, unspecified: Secondary | ICD-10-CM

## 2020-02-26 DIAGNOSIS — Z0001 Encounter for general adult medical examination with abnormal findings: Secondary | ICD-10-CM | POA: Diagnosis not present

## 2020-02-26 DIAGNOSIS — R739 Hyperglycemia, unspecified: Secondary | ICD-10-CM | POA: Insufficient documentation

## 2020-02-26 DIAGNOSIS — E663 Overweight: Secondary | ICD-10-CM

## 2020-02-26 DIAGNOSIS — R05 Cough: Secondary | ICD-10-CM

## 2020-02-26 DIAGNOSIS — E039 Hypothyroidism, unspecified: Secondary | ICD-10-CM | POA: Diagnosis not present

## 2020-02-26 MED ORDER — FINASTERIDE 5 MG PO TABS: 5.0000 mg | ORAL_TABLET | Freq: Every day | ORAL | 3 refills | Status: DC

## 2020-02-26 MED ORDER — LEVOTHYROXINE SODIUM 100 MCG PO TABS: 100.0000 ug | ORAL_TABLET | Freq: Every day | ORAL | 3 refills | Status: DC

## 2020-02-26 NOTE — Patient Instructions (Signed)
It was very nice to see you today!  We will check blood work today.  I will refill your medications.  I will see you back in a year.  Please come back to me sooner if needed.  Take care, Dr Jerline Pain  Please try these tips to maintain a healthy lifestyle:   Eat at least 3 REAL meals and 1-2 snacks per day.  Aim for no more than 5 hours between eating.  If you eat breakfast, please do so within one hour of getting up.    Each meal should contain half fruits/vegetables, one quarter protein, and one quarter carbs (no bigger than a computer mouse)   Cut down on sweet beverages. This includes juice, soda, and sweet tea.     Drink at least 1 glass of water with each meal and aim for at least 8 glasses per day   Exercise at least 150 minutes every week.    Preventive Care 81-24 Years Old, Male Preventive care refers to lifestyle choices and visits with your health care provider that can promote health and wellness. This includes:  A yearly physical exam. This is also called an annual well check.  Regular dental and eye exams.  Immunizations.  Screening for certain conditions.  Healthy lifestyle choices, such as eating a healthy diet, getting regular exercise, not using drugs or products that contain nicotine and tobacco, and limiting alcohol use. What can I expect for my preventive care visit? Physical exam Your health care provider will check:  Height and weight. These may be used to calculate body mass index (BMI), which is a measurement that tells if you are at a healthy weight.  Heart rate and blood pressure.  Your skin for abnormal spots. Counseling Your health care provider may ask you questions about:  Alcohol, tobacco, and drug use.  Emotional well-being.  Home and relationship well-being.  Sexual activity.  Eating habits.  Work and work Statistician. What immunizations do I need?  Influenza (flu) vaccine  This is recommended every year. Tetanus,  diphtheria, and pertussis (Tdap) vaccine  You may need a Td booster every 10 years. Varicella (chickenpox) vaccine  You may need this vaccine if you have not already been vaccinated. Zoster (shingles) vaccine  You may need this after age 82. Measles, mumps, and rubella (MMR) vaccine  You may need at least one dose of MMR if you were born in 1957 or later. You may also need a second dose. Pneumococcal conjugate (PCV13) vaccine  You may need this if you have certain conditions and were not previously vaccinated. Pneumococcal polysaccharide (PPSV23) vaccine  You may need one or two doses if you smoke cigarettes or if you have certain conditions. Meningococcal conjugate (MenACWY) vaccine  You may need this if you have certain conditions. Hepatitis A vaccine  You may need this if you have certain conditions or if you travel or work in places where you may be exposed to hepatitis A. Hepatitis B vaccine  You may need this if you have certain conditions or if you travel or work in places where you may be exposed to hepatitis B. Haemophilus influenzae type b (Hib) vaccine  You may need this if you have certain risk factors. Human papillomavirus (HPV) vaccine  If recommended by your health care provider, you may need three doses over 6 months. You may receive vaccines as individual doses or as more than one vaccine together in one shot (combination vaccines). Talk with your health care provider about the  risks and benefits of combination vaccines. What tests do I need? Blood tests  Lipid and cholesterol levels. These may be checked every 5 years, or more frequently if you are over 95 years old.  Hepatitis C test.  Hepatitis B test. Screening  Lung cancer screening. You may have this screening every year starting at age 12 if you have a 30-pack-year history of smoking and currently smoke or have quit within the past 15 years.  Prostate cancer screening. Recommendations will vary  depending on your family history and other risks.  Colorectal cancer screening. All adults should have this screening starting at age 21 and continuing until age 33. Your health care provider may recommend screening at age 19 if you are at increased risk. You will have tests every 1-10 years, depending on your results and the type of screening test.  Diabetes screening. This is done by checking your blood sugar (glucose) after you have not eaten for a while (fasting). You may have this done every 1-3 years.  Sexually transmitted disease (STD) testing. Follow these instructions at home: Eating and drinking  Eat a diet that includes fresh fruits and vegetables, whole grains, lean protein, and low-fat dairy products.  Take vitamin and mineral supplements as recommended by your health care provider.  Do not drink alcohol if your health care provider tells you not to drink.  If you drink alcohol: ? Limit how much you have to 0-2 drinks a day. ? Be aware of how much alcohol is in your drink. In the [place] one drink equals one 12 oz bottle of beer (355 mL), one 5 oz glass of wine (148 mL), or one 1 oz glass of hard liquor (44 mL). Lifestyle  Take daily care of your teeth and gums.  Stay active. Exercise for at least 30 minutes on 5 or more days each week.  Do not use any products that contain nicotine or tobacco, such as cigarettes, e-cigarettes, and chewing tobacco. If you need help quitting, ask your health care provider.  If you are sexually active, practice safe sex. Use a condom or other form of protection to prevent STIs (sexually transmitted infections).  Talk with your health care provider about taking a low-dose aspirin every day starting at age 67. What's next?  Go to your health care provider once a year for a well check visit.  Ask your health care provider how often you should have your eyes and teeth checked.  Stay up to date on all vaccines. This information is not  intended to replace advice given to you by your health care provider. Make sure you discuss any questions you have with your health care provider. Document Revised: 06/15/2018 Document Reviewed: 06/15/2018 Elsevier Patient Education  2020 [employer].

## 2020-02-26 NOTE — Assessment & Plan Note (Signed)
Check lipids 

## 2020-02-26 NOTE — Assessment & Plan Note (Signed)
-   Continue Synthroid 112 mcg daily.  - Check TSH.

## 2020-02-26 NOTE — Assessment & Plan Note (Signed)
Check A1c. 

## 2020-02-26 NOTE — Progress Notes (Signed)
Chief Complaint:  Jerry Owens is a 44 y.o. male who presents today for his annual comprehensive physical exam.    Assessment/Plan:  Chronic Problems Addressed Today: Hyperglycemia Check A1c.  Dyslipidemia Check lipids.  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  Hypothyroidism Continue Synthroid 112 mcg daily.  Check TSH.   Body mass index is 28.59 kg/m. / Overweight  BMI Metric Follow Up - 02/26/20 0955      BMI Metric Follow Up-Please document annually   BMI Metric Follow Up Education provided            Preventative Healthcare: Due for colonoscopy next year.  Check CBC, CMET, TSH, A1c, lipid panel.  Discussed Covid vaccine.  Patient Counseling(The following topics were reviewed and/or handout was given):  -Nutrition: Stressed importance of moderation in sodium/caffeine intake, saturated fat and cholesterol, caloric balance, sufficient intake of fresh fruits, vegetables, and fiber.  -Stressed the importance of regular exercise.   -Substance Abuse: Discussed cessation/primary prevention of tobacco, alcohol, or other drug use; driving or other dangerous activities under the influence; availability of treatment for abuse.   -Injury prevention: Discussed safety belts, safety helmets, smoke detector, smoking near bedding or upholstery.   -Sexuality: Discussed sexually transmitted diseases, partner selection, use of condoms, avoidance of unintended pregnancy and contraceptive alternatives.   -Dental health: Discussed importance of regular tooth brushing, flossing, and dental visits.  -Health maintenance and immunizations reviewed. Please refer to Health maintenance section.  Return to care in 1 year for next preventative visit.     Subjective:  HPI:  He has no acute complaints today.   Lifestyle Diet: None specific. Exercise: Busy with work and remodeling house.   Depression screen [facility] 2/9 07/26/2018  Decreased Interest 0  Down, Depressed,  Hopeless 0  PHQ - 2 Score 0  Altered sleeping -  Tired, decreased energy -  Change in appetite -  Feeling bad or failure about yourself  -  Trouble concentrating -  Moving slowly or fidgety/restless -  Suicidal thoughts -  PHQ-9 Score -  Difficult doing work/chores -    Health Maintenance Due  Topic Date Due   Hepatitis C Screening  Never done   COVID-19 Vaccine (1) Never done     ROS: Per HPI, otherwise a complete review of systems was negative.   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   Past Surgical History:  Procedure Laterality Date   COLONOSCOPY WITH PROPOFOL N/A 12/22/2015   Procedure: COLONOSCOPY WITH PROPOFOL;  Surgeon: Charolett Bumpers, MD;  Location: [facility];  Service: Endoscopy;  Laterality: N/A;   DISTAL BICEPS TENDON REPAIR Right 05/18/2017   Procedure: RIGHT DISTAL BICEPS REPAIR;  Surgeon: Tarry Kos, MD;  Location: [facility];  Service: Orthopedics;  Laterality: Right;   DISTAL BICEPS TENDON REPAIR Left 05/28/2019   Procedure: LEFT DISTAL BICEPS REPAIR;  Surgeon: Tarry Kos, MD;  Location: [facility];  Service: Orthopedics;  Laterality: Left;   SHOULDER SURGERY     left rotator cuff surgery    Family History  Problem  Relation Age of Onset   Cancer Mother    Heart disease Father    Cancer Paternal Grandmother    Stroke Paternal Grandmother     Medications- reviewed and updated Current Outpatient Medications  Medication Sig Dispense Refill   Ascorbic Acid (VITAMIN C) 100 MG tablet Take 100 mg by mouth daily.       finasteride (PROSCAR) 5 MG tablet Take 1 tablet (5 mg total) by mouth daily. 90 tablet 3   levothyroxine (SYNTHROID) 100 MCG tablet Take 1 tablet (100 mcg total) by mouth daily. 90 tablet 3   No current facility-administered medications for this visit.    Allergies-reviewed and updated Allergies  Allergen Reactions   Amoxicillin Anaphylaxis    Swelling in face, neck, ears, and chest   Penicillins Anaphylaxis    facial and neck swelling    Social History   Socioeconomic History   Marital status: Married    Spouse name: Not on file   Number of children: 2   Years of education: Not on file   Highest education level: Not on file  Occupational History   Not on file  Tobacco Use   Smoking status: Former Smoker    Types: Cigarettes    Quit date: 11/21/2004    Years since quitting: 15.2   Smokeless tobacco: Never Used  Vaping Use   Vaping Use: Never used  Substance and Sexual Activity   Alcohol use: Yes    Comment: social   Drug use: No   Sexual activity: Yes  Other Topics Concern   Not on file  Social History Narrative   Not on file   Social Determinants of Health   Financial Resource Strain:    Difficulty of Paying Living Expenses: Not on file  Food Insecurity:    Worried About Programme researcher, broadcasting/film/video in the Last Year: Not on file   [employer] of Food in the Last Year: Not on file  Transportation Needs:    Lack of Transportation (Medical): Not on file   Lack of Transportation (Non-Medical): Not on file  Physical Activity:    Days of Exercise per Week: Not on file   Minutes of Exercise per Session: Not on file  Stress:    Feeling of Stress : Not on file  Social Connections:    Frequency of Communication with Friends and Family: Not on file   Frequency of Social Gatherings with Friends and Family: Not on file   Attends Religious Services: Not on file   Active Member of Clubs or Organizations: Not on file   Attends Banker  Meetings: Not on file   Marital Status: Not on file        Objective:  Physical Exam: BP 124/78    Pulse (!) 47    Temp 97.6 F (36.4 C) (Temporal)    Ht 5\' 8"  (1.727 m)    Wt 188 lb (85.3 kg)    SpO2 100%    BMI 28.59 kg/m   Body mass index is 28.59 kg/m. Wt Readings from Last 3 Encounters:  02/26/20 188 lb (85.3 kg)  05/28/19 190 lb 7.6 oz (86.4 kg)  05/08/19 185 lb (83.9 kg)   Gen: NAD, resting comfortably HEENT: TMs normal bilaterally. OP clear. No thyromegaly noted.  CV: RRR with no murmurs appreciated Pulm: NWOB, CTAB with no crackles, wheezes, or rhonchi GI: Normal bowel sounds present. Soft, Nontender, Nondistended. MSK: no edema, cyanosis, or clubbing noted Skin: warm, dry Neuro: CN2-12 grossly intact.  Strength 5/5 in upper and lower extremities. Reflexes symmetric and intact bilaterally.  Psych: Normal affect and thought content     Modean Mccullum M. [name], MD 02/26/2020 9:56 AM

## 2020-02-26 NOTE — Assessment & Plan Note (Signed)
Continue finasteride 1.25 mg daily.

## 2020-02-27 LAB — COMPREHENSIVE METABOLIC PANEL
AG Ratio: 1.8 (calc) (ref 1.0–2.5)
ALT: 19 U/L (ref 9–46)
AST: 17 U/L (ref 10–40)
Albumin: 4.6 g/dL (ref 3.6–5.1)
Alkaline phosphatase (APISO): 44 U/L (ref 36–130)
BUN: 11 mg/dL (ref 7–25)
CO2: 26 mmol/L (ref 20–32)
Calcium: 9.4 mg/dL (ref 8.6–10.3)
Chloride: 103 mmol/L (ref 98–110)
Creat: 0.87 mg/dL (ref 0.60–1.35)
Globulin: 2.5 g/dL (calc) (ref 1.9–3.7)
Glucose, Bld: 92 mg/dL (ref 65–99)
Potassium: 4.1 mmol/L (ref 3.5–5.3)
Sodium: 140 mmol/L (ref 135–146)
Total Bilirubin: 0.5 mg/dL (ref 0.2–1.2)
Total Protein: 7.1 g/dL (ref 6.1–8.1)

## 2020-02-27 LAB — CBC
HCT: 46 % (ref 38.5–50.0)
Hemoglobin: 15.8 g/dL (ref 13.2–17.1)
MCH: 30.8 pg (ref 27.0–33.0)
MCHC: 34.3 g/dL (ref 32.0–36.0)
MCV: 89.7 fL (ref 80.0–100.0)
MPV: 9.5 fL (ref 7.5–12.5)
Platelets: 273 10*3/uL (ref 140–400)
RBC: 5.13 10*6/uL (ref 4.20–5.80)
RDW: 12.2 % (ref 11.0–15.0)
WBC: 8 10*3/uL (ref 3.8–10.8)

## 2020-02-27 LAB — LIPID PANEL
Cholesterol: 167 mg/dL (ref <200)
HDL: 40 mg/dL (ref >=40)
LDL Cholesterol (Calc): 110 mg/dL (calc) — ABNORMAL HIGH
Non-HDL Cholesterol (Calc): 127 mg/dL (calc) (ref <130)
Total CHOL/HDL Ratio: 4.2 (calc) (ref <5.0)
Triglycerides: 77 mg/dL (ref <150)

## 2020-02-27 LAB — TSH: TSH: 1.56 mIU/L (ref 0.40–4.50)

## 2020-02-27 LAB — HEMOGLOBIN A1C
Hgb A1c MFr Bld: 5.4 % of total Hgb (ref <5.7)
Mean Plasma Glucose: 108 (calc)
eAG (mmol/L): 6 (calc)

## 2020-02-27 LAB — SARS-COV-2 ANTIBODY(IGG)SPIKE,SEMI-QUANTITATIVE: SARS COV1 AB(IGG)SPIKE,SEMI QN: <1 index (ref <1.00)

## 2020-02-27 NOTE — Progress Notes (Signed)
Please inform patient of the following:  His "bad" cholesterol is mildly elevated but everything else is NORMAL. His covid antibody is negative.  Would like for him to keep working on diet and exercise and we can recheck in a year.  Katina Degree. [name], MD 02/27/2020 11:12 AM

## 2020-03-04 ENCOUNTER — Telehealth: Payer: Self-pay

## 2020-03-04 NOTE — Telephone Encounter (Signed)
Pt is returning call from yesterday.  °

## 2021-02-26 ENCOUNTER — Encounter: Payer: 59 | Admitting: Family Medicine

## 2021-03-18 ENCOUNTER — Other Ambulatory Visit: Payer: Self-pay | Admitting: Family Medicine

## 2021-04-07 ENCOUNTER — Other Ambulatory Visit: Payer: Self-pay

## 2021-04-07 ENCOUNTER — Encounter: Payer: Self-pay | Admitting: Family Medicine

## 2021-04-07 ENCOUNTER — Ambulatory Visit (INDEPENDENT_AMBULATORY_CARE_PROVIDER_SITE_OTHER): Payer: 59 | Admitting: Family Medicine

## 2021-04-07 VITALS — BP 131/84 | HR 79 | Temp 98.1°F | Ht 68.0 in | Wt 192.0 lb

## 2021-04-07 DIAGNOSIS — R059 Cough, unspecified: Secondary | ICD-10-CM

## 2021-04-07 DIAGNOSIS — R739 Hyperglycemia, unspecified: Secondary | ICD-10-CM

## 2021-04-07 DIAGNOSIS — E663 Overweight: Secondary | ICD-10-CM

## 2021-04-07 DIAGNOSIS — E039 Hypothyroidism, unspecified: Secondary | ICD-10-CM

## 2021-04-07 DIAGNOSIS — Z0001 Encounter for general adult medical examination with abnormal findings: Secondary | ICD-10-CM

## 2021-04-07 DIAGNOSIS — E785 Hyperlipidemia, unspecified: Secondary | ICD-10-CM

## 2021-04-07 DIAGNOSIS — Z6829 Body mass index (BMI) 29.0-29.9, adult: Secondary | ICD-10-CM

## 2021-04-07 DIAGNOSIS — L649 Androgenic alopecia, unspecified: Secondary | ICD-10-CM

## 2021-04-07 MED ORDER — CIPROFLOXACIN-DEXAMETHASONE 0.3-0.1 % OT SUSP: 4.0000 [drp] | Freq: Two times a day (BID) | OTIC | 0 refills | Status: DC

## 2021-04-07 MED ORDER — AZELASTINE HCL 0.1 % NA SOLN: 2.0000 | Freq: Two times a day (BID) | NASAL | 12 refills | Status: DC

## 2021-04-07 MED ORDER — DOXYCYCLINE HYCLATE 100 MG PO TABS: 100.0000 mg | ORAL_TABLET | Freq: Two times a day (BID) | ORAL | 0 refills | Status: DC

## 2021-04-07 NOTE — Assessment & Plan Note (Signed)
Check labs 

## 2021-04-07 NOTE — Patient Instructions (Signed)
It was very nice to see you today!  We will check blood work.  I think that you are having some postnasal drip that is causing your cough and the pain in your chest when you call.  We will treat you for a sinus infection.  Let me know if your symptoms are not improving over the next several days.  Please start the drop for your ears.  I will see you back in 1 year for your next physical. Come back to see me sooner if needed.   Take care, Dr [name]  PLEASE NOTE:  If you had any lab tests please let us know if you have not heard back within a few days. You may see your results on mychart before we have a chance to review them but we will give you a call once they are reviewed by [place]. If we ordered any referrals today, please let us know if you have not heard from their office within the next week.   Please try these tips to maintain a healthy lifestyle:  Eat at least 3 REAL meals and 1-2 snacks per day.  Aim for no more than 5 hours between eating.  If you eat breakfast, please do so within one hour of getting up.   Each meal should contain half fruits/vegetables, one quarter protein, and one quarter carbs (no bigger than a computer mouse)  Cut down on sweet beverages. This includes juice, soda, and sweet tea.   Drink at least 1 glass of water with each meal and aim for at least 8 glasses per day  Exercise at least 150 minutes every week.    Preventive Care 22-8 Years Old, Male Preventive care refers to lifestyle choices and visits with your health care provider that can promote health and wellness. This includes: A yearly physical exam. This is also called an annual wellness visit. Regular dental and eye exams. Immunizations. Screening for certain conditions. Healthy lifestyle choices, such as: Eating a healthy diet. Getting regular exercise. Not using drugs or products that contain nicotine and tobacco. Limiting alcohol use. What can I expect for my preventive care  visit? Physical exam Your health care provider will check your: Height and weight. These may be used to calculate your BMI (body mass index). BMI is a measurement that tells if you are at a healthy weight. Heart rate and blood pressure. Body temperature. Skin for abnormal spots. Counseling Your health care provider may ask you questions about your: Past medical problems. Family's medical history. Alcohol, tobacco, and drug use. Emotional well-being. Home life and relationship well-being. Sexual activity. Diet, exercise, and sleep habits. Work and work Astronomer. Access to firearms. What immunizations do I need? Vaccines are usually given at various ages, according to a schedule. Your health care provider will recommend vaccines for you based on your age, medical history, and lifestyle or other factors, such as travel or where you work. What tests do I need? Blood tests Lipid and cholesterol levels. These may be checked every 5 years, or more often if you are over 59 years old. Hepatitis C test. Hepatitis B test. Screening Lung cancer screening. You may have this screening every year starting at age 60 if you have a 30-pack-year history of smoking and currently smoke or have quit within the past 15 years. Prostate cancer screening. Recommendations will vary depending on your family history and other risks. Genital exam to check for testicular cancer or hernias. Colorectal cancer screening. All adults should have this  screening starting at age 25 and continuing until age 44. Your health care provider may recommend screening at age 14 if you are at increased risk. You will have tests every 1-10 years, depending on your results and the type of screening test. Diabetes screening. This is done by checking your blood sugar (glucose) after you have not eaten for a while (fasting). You may have this done every 1-3 years. STD (sexually transmitted disease) testing, if you are at  risk. Follow these instructions at home: Eating and drinking  Eat a diet that includes fresh fruits and vegetables, whole grains, lean protein, and low-fat dairy products. Take vitamin and mineral supplements as recommended by your health care provider. Do not drink alcohol if your health care provider tells you not to drink. If you drink alcohol: Limit how much you have to 0-2 drinks a day. Be aware of how much alcohol is in your drink. In the [place] one drink equals one 12 oz bottle of beer (355 mL), one 5 oz glass of wine (148 mL), or one 1 oz glass of hard liquor (44 mL). Lifestyle Take daily care of your teeth and gums. Brush your teeth every morning and night with fluoride toothpaste. Floss one time each day. Stay active. Exercise for at least 30 minutes 5 or more days each week. Do not use any products that contain nicotine or tobacco, such as cigarettes, e-cigarettes, and chewing tobacco. If you need help quitting, ask your health care provider. Do not use drugs. If you are sexually active, practice safe sex. Use a condom or other form of protection to prevent STIs (sexually transmitted infections). If told by your health care provider, take low-dose aspirin daily starting at age 85. Find healthy ways to cope with stress, such as: Meditation, yoga, or listening to music. Journaling. Talking to a trusted person. Spending time with friends and family. Safety Always wear your seat belt while driving or riding in a vehicle. Do not drive: If you have been drinking alcohol. Do not ride with someone who has been drinking. When you are tired or distracted. While texting. Wear a helmet and other protective equipment during sports activities. If you have firearms in your house, make sure you follow all gun safety procedures. What's next? Go to your health care provider once a year for an annual wellness visit. Ask your health care provider how often you should have your eyes and teeth  checked. Stay up to date on all vaccines. This information is not intended to replace advice given to you by your health care provider. Make sure you discuss any questions you have with your health care provider. Document Revised: 08/29/2020 Document Reviewed: 06/15/2018 Elsevier Patient Education  2022 [employer].

## 2021-04-07 NOTE — Assessment & Plan Note (Signed)
Check A1c. 

## 2021-04-07 NOTE — Assessment & Plan Note (Signed)
Check TSH. Continue synthroid 100 mcg daily. 

## 2021-04-07 NOTE — Assessment & Plan Note (Signed)
Continue finasteride 1.25 mg daily.

## 2021-04-07 NOTE — Progress Notes (Signed)
Chief Complaint:  Jerry Owens is a 45 y.o. male who presents today for his annual comprehensive physical exam.    Assessment/Plan:  New/Acute Problems: Cough Reassuring heart and lung exam today.  He does have history of recurrent sinusitis and concerning symptoms for postnasal drip.  We will treat possible underlying sinusitis with course of doxycycline and Astelin.  This combination is worked well for him in the past.  He will let me know if his symptoms or not improving over the next several days.  At that point would consider chest x-ray or possibly referral to pulmonology.  Ear Irritation Start Ciprodex drops.  Chronic Problems Addressed Today: Hyperglycemia Check A1c.  Dyslipidemia Check labs.  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  Hypothyroidism Check TSH. Continue synthroid daily.    Body mass index is 29.19 kg/m. / Overweight    Preventative Healthcare: Will get blood work done today. Due for colonoscopy in 2022.  Patient Counseling(The following topics were reviewed and/or handout was given):  -Nutrition: Stressed importance of moderation in sodium/caffeine intake, saturated fat and cholesterol, caloric balance, sufficient intake of fresh fruits, vegetables, and fiber.  -Stressed the importance of regular exercise.   -Substance Abuse: Discussed cessation/primary prevention of tobacco, alcohol, or other drug use; driving or other dangerous activities under the influence; availability of treatment for abuse.   -Injury prevention: Discussed safety belts, safety helmets, smoke detector, smoking near bedding or upholstery.   -Sexuality: Discussed sexually transmitted diseases, partner selection, use of condoms, avoidance of unintended pregnancy and contraceptive alternatives.   -Dental health: Discussed importance of regular tooth brushing, flossing, and dental visits.  -Health maintenance and immunizations reviewed. Please refer to  Health maintenance section.  Return to care in 1 year for next preventative visit.     Subjective:  HPI:  He has no acute complaints today.   HE has had a lingering cough for several months.  Intermittent in nature.  Seems to be worsening recently.  Occasionally has a sharp sensation of pain in his mid lower chest radiates into his back.  This only occurs with coughing.  No chest pain with exertion.  Hospitalist. Associated symptoms are ear ache and some fluid in the ear. Cough is worse when laying down. He denies shortness off breath.  Denies fever, chills nausea or vomiting.  No issues with heartburn or reflux. He had had issue sinus congestion before.  He reports this usually occurs in winter. He tried sinus spray before which helped.  Lifestyle Diet: Balanced Exercise: Limited  Depression screen PHQ 2/9 04/07/2021  Decreased Interest 0  Down, Depressed, Hopeless 0  PHQ - 2 Score 0  Altered sleeping -  Tired, decreased energy -  Change in appetite -  Feeling bad or failure about yourself  -  Trouble concentrating -  Moving slowly or fidgety/restless -  Suicidal thoughts -  PHQ-9 Score -  Difficult doing work/chores -    Health Maintenance Due  Topic Date Due   COVID-19 Vaccine (1) Never done   Hepatitis C Screening  Never done     ROS: Per HPI, otherwise a complete review of systems was negative.   PMH:  The following were reviewed and entered/updated in epic: Past Medical History:  Diagnosis Date   Complication of anesthesia    hard to wake up   Hypothyroidism    PONV (postoperative nausea and vomiting)    Rupture of right distal biceps tendon    Patient Active Problem List  Diagnosis Date Noted   Hyperglycemia 02/26/2020   Dyslipidemia 11/23/2017   Hypothyroidism 07/28/2017   Androgenic alopecia 07/28/2017   Past Surgical History:  Procedure Laterality Date   COLONOSCOPY WITH PROPOFOL N/A 12/22/2015   Procedure: COLONOSCOPY WITH PROPOFOL;  Surgeon:  Charolett Bumpers, MD;  Location: [facility];  Service: Endoscopy;  Laterality: N/A;   DISTAL BICEPS TENDON REPAIR Right 05/18/2017   Procedure: RIGHT DISTAL BICEPS REPAIR;  Surgeon: Tarry Kos, MD;  Location: [facility];  Service: Orthopedics;  Laterality: Right;   DISTAL BICEPS TENDON REPAIR Left 05/28/2019   Procedure: LEFT DISTAL BICEPS REPAIR;  Surgeon: Tarry Kos, MD;  Location:  SURGERY CENTER;  Service: Orthopedics;  Laterality: Left;   SHOULDER SURGERY     left rotator cuff surgery    Family History  Problem Relation Age of Onset   Cancer Mother    Heart disease Father    Cancer Paternal Grandmother    Stroke Paternal Grandmother     Medications- reviewed and updated Current Outpatient Medications  Medication Sig Dispense Refill   Ascorbic Acid (VITAMIN C) 100 MG tablet Take 100 mg by mouth daily.     azelastine (ASTELIN) 0.1 % nasal spray Place 2 sprays into both nostrils 2 (two) times daily. 30 mL 12   ciprofloxacin-dexamethasone (CIPRODEX) OTIC suspension Place 4 drops into both ears 2 (two) times daily. 7.5 mL 0   doxycycline (VIBRA-TABS) 100 MG tablet Take 1 tablet (100 mg total) by mouth 2 (two) times daily. 14 tablet 0   finasteride (PROSCAR) 5 MG tablet Take 1 tablet (5 mg total) by mouth daily. 90 tablet 3   levothyroxine (SYNTHROID) 100 MCG tablet Take 1 tablet by mouth once daily 30 tablet 0   No current facility-administered medications for this visit.    Allergies-reviewed and updated Allergies  Allergen Reactions   Amoxicillin Anaphylaxis    Swelling in face, neck, ears, and chest   Penicillins Anaphylaxis    facial and neck swelling    Social History   Socioeconomic History   Marital status: Married    Spouse name: Not on file   Number of children: 2   Years of education: Not on file   Highest education level: Not on file  Occupational History   Not on file  Tobacco Use   Smoking status: Former    Types:  Cigarettes    Quit date: 11/21/2004    Years since quitting: 16.3   Smokeless tobacco: Never  Vaping Use   Vaping Use: Never used  Substance and Sexual Activity   Alcohol use: Yes    Comment: social   Drug use: No   Sexual activity: Yes  Other Topics Concern   Not on file  Social History Narrative   Not on file   Social Determinants of Health   Financial Resource Strain: Not on file  Food Insecurity: Not on file  Transportation Needs: Not on file  Physical Activity: Not on file  Stress: Not on file  Social Connections: Not on file        Objective:  Physical Exam: BP 131/84   Pulse 79   Temp 98.1 F (36.7 C) (Temporal)   Ht 5\' 8"  (1.727 m)   Wt 192 lb (87.1 kg)   SpO2 98%   BMI 29.19 kg/m   Body mass index is 29.19 kg/m. Wt Readings from Last 3 Encounters:  04/07/21 192 lb (87.1 kg)  02/26/20 188 lb (85.3 kg)  05/28/19  190 lb 7.6 oz (86.4 kg)   Gen: NAD, resting comfortably HEENT: TMs normal bilaterally. Left EAC with irritation and small amount of bleeding due to excoriation. OP clear. No thyromegaly noted.  CV: RRR with no murmurs appreciated Pulm: NWOB, CTAB with no crackles, wheezes, or rhonchi GI: Normal bowel sounds present. Soft, Nontender, Nondistended. MSK: no edema, cyanosis, or clubbing noted Skin: warm, dry Neuro: CN2-12 grossly intact. Strength 5/5 in upper and lower extremities. Reflexes symmetric and intact bilaterally.  Psych: Normal affect and thought content      I,Savera Zaman,acting as a scribe for Jacquiline Doe, MD.,have documented all relevant documentation on the behalf of Jacquiline Doe, MD,as directed by  Jacquiline Doe, MD while in the presence of Jacquiline Doe, MD.   I, Jacquiline Doe, MD, have reviewed all documentation for this visit. The documentation on 04/07/21 for the exam, diagnosis, procedures, and orders are all accurate and complete.  Katina Degree. [name], MD 04/07/2021 2:58 PM

## 2021-04-08 LAB — COMPREHENSIVE METABOLIC PANEL
ALT: 18 U/L (ref 0–53)
AST: 18 U/L (ref 0–37)
Albumin: 4.7 g/dL (ref 3.5–5.2)
Alkaline Phosphatase: 58 U/L (ref 39–117)
BUN: 11 mg/dL (ref 6–23)
CO2: 29 mEq/L (ref 19–32)
Calcium: 9.6 mg/dL (ref 8.4–10.5)
Chloride: 100 mEq/L (ref 96–112)
Creatinine, Ser: 0.95 mg/dL (ref 0.40–1.50)
GFR: 95.17 mL/min (ref >60.00)
Glucose, Bld: 72 mg/dL (ref 70–99)
Potassium: 3.7 mEq/L (ref 3.5–5.1)
Sodium: 139 mEq/L (ref 135–145)
Total Bilirubin: 0.5 mg/dL (ref 0.2–1.2)
Total Protein: 7.7 g/dL (ref 6.0–8.3)

## 2021-04-08 LAB — LIPID PANEL
Cholesterol: 148 mg/dL (ref 0–200)
HDL: 38.5 mg/dL — ABNORMAL LOW (ref >39.00)
LDL Cholesterol: 90 mg/dL (ref 0–99)
NonHDL: 109.8
Total CHOL/HDL Ratio: 4
Triglycerides: 101 mg/dL (ref 0.0–149.0)
VLDL: 20.2 mg/dL (ref 0.0–40.0)

## 2021-04-08 LAB — CBC
HCT: 48.9 % (ref 39.0–52.0)
Hemoglobin: 16.6 g/dL (ref 13.0–17.0)
MCHC: 33.9 g/dL (ref 30.0–36.0)
MCV: 90.4 fl (ref 78.0–100.0)
Platelets: 303 10*3/uL (ref 150.0–400.0)
RBC: 5.41 Mil/uL (ref 4.22–5.81)
RDW: 12.7 % (ref 11.5–15.5)
WBC: 10.6 10*3/uL — ABNORMAL HIGH (ref 4.0–10.5)

## 2021-04-08 LAB — HEMOGLOBIN A1C: Hgb A1c MFr Bld: 6 % (ref 4.6–6.5)

## 2021-04-08 LAB — TSH: TSH: 4.75 u[IU]/mL (ref 0.35–5.50)

## 2021-04-09 NOTE — Progress Notes (Signed)
Please inform patient of the following:  His blood sugar is borderline but everything else is stable. Do not need to make any changes to his treatment plan at this time and we can recheck in a year or so.  Katina Degree. [name], MD 04/09/2021 12:57 PM

## 2021-04-21 ENCOUNTER — Other Ambulatory Visit: Payer: Self-pay | Admitting: Family Medicine

## 2021-04-22 ENCOUNTER — Telehealth: Payer: Self-pay

## 2021-04-22 NOTE — Telephone Encounter (Signed)
[facility] states that Synthroid was sent in but they no longer carry that specific manufacture, wondering if okay to change.

## 2021-04-22 NOTE — Telephone Encounter (Signed)
See below

## 2021-04-23 ENCOUNTER — Other Ambulatory Visit: Payer: Self-pay | Admitting: *Deleted

## 2021-04-23 DIAGNOSIS — E039 Hypothyroidism, unspecified: Secondary | ICD-10-CM

## 2021-04-23 NOTE — Telephone Encounter (Signed)
Ok but I recommend he come back in 4-6 weeks to recheck TSH to make sure it is still at goal.  Katina Degree. [name], MD 04/23/2021 8:02 AM

## 2021-04-23 NOTE — Telephone Encounter (Signed)
Patient aware of information Future lab order

## 2021-05-17 ENCOUNTER — Other Ambulatory Visit: Payer: Self-pay | Admitting: Family Medicine

## 2021-05-18 ENCOUNTER — Other Ambulatory Visit: Payer: Self-pay

## 2021-05-18 ENCOUNTER — Other Ambulatory Visit (INDEPENDENT_AMBULATORY_CARE_PROVIDER_SITE_OTHER): Payer: 59

## 2021-05-18 DIAGNOSIS — E039 Hypothyroidism, unspecified: Secondary | ICD-10-CM

## 2021-05-18 LAB — TSH: TSH: 1.66 u[IU]/mL (ref 0.35–5.50)

## 2021-05-19 NOTE — Progress Notes (Signed)
Please inform patient of the following:  Thyroid level is NORMAL.  Jerry Owens. [name], MD 05/19/2021 7:55 AM

## 2021-06-16 ENCOUNTER — Other Ambulatory Visit: Payer: Self-pay | Admitting: Family Medicine

## 2021-07-03 ENCOUNTER — Telehealth: Payer: 59 | Admitting: Family Medicine

## 2021-07-03 ENCOUNTER — Other Ambulatory Visit: Payer: Self-pay

## 2021-07-03 VITALS — Ht 68.0 in

## 2021-07-03 DIAGNOSIS — U071 COVID-19: Secondary | ICD-10-CM | POA: Diagnosis not present

## 2021-07-03 MED ORDER — NIRMATRELVIR/RITONAVIR (PAXLOVID)TABLET: 3.0000 | ORAL_TABLET | Freq: Two times a day (BID) | ORAL | 0 refills | Status: AC

## 2021-07-03 NOTE — Progress Notes (Signed)
° °  NICK STULTS is a 45 y.o. male who presents today for a virtual office visit.  Assessment/Plan:  COVID Thankfully symptoms are relatively mild though he is at increased risk as he is not vaccinated.  We discussed treatment with antivirals.  Would be reasonable to start at this point.  We will send him to his pharmacy.  Recommended good oral hydration.  He can continue over-the-counter meds as needed.  Discussed reasons to return to care or seek emergent care.    Subjective:  HPI:  Patient here with covid. Symptoms started a few days ago. Wife also recently diagnosed with covid. Yesterday his covid test was positive. Symptoms include malaise, myalgias, fever, drainage, congestion. Tried ibuprofen which has not helped. Has had some sore throat. No reported chest pain or shortness of breath.        Objective/Observations  Physical Exam: Gen: NAD, resting comfortably Pulm: Normal work of breathing Neuro: Grossly normal, moves all extremities Psych: Normal affect and thought content  Virtual Visit via Video   I connected with Rico Ala on 07/03/21 at  3:40 PM EST by a video enabled telemedicine application and verified that I am speaking with the correct person using two identifiers. The limitations of evaluation and management by telemedicine and the availability of in person appointments were discussed. The patient expressed understanding and agreed to proceed.   Patient location: Home Provider location: [place] [employer] Persons participating in the virtual visit: Myself and Patient     Katina Degree. [name], MD 07/03/2021 9:00 AM

## 2021-07-16 ENCOUNTER — Other Ambulatory Visit: Payer: Self-pay | Admitting: Family Medicine

## 2021-08-15 ENCOUNTER — Other Ambulatory Visit: Payer: Self-pay | Admitting: Family Medicine

## 2021-09-21 ENCOUNTER — Other Ambulatory Visit: Payer: Self-pay | Admitting: Family Medicine

## 2021-10-23 ENCOUNTER — Other Ambulatory Visit: Payer: Self-pay | Admitting: Family Medicine

## 2021-11-23 ENCOUNTER — Other Ambulatory Visit: Payer: Self-pay | Admitting: *Deleted

## 2021-11-23 ENCOUNTER — Telehealth: Payer: Self-pay

## 2021-11-23 MED ORDER — LEVOTHYROXINE SODIUM 100 MCG PO TABS: 100.0000 ug | ORAL_TABLET | Freq: Every day | ORAL | 1 refills | Status: DC

## 2021-11-23 NOTE — Telephone Encounter (Signed)
Rx send to [facility] pharmacy  

## 2021-11-23 NOTE — Telephone Encounter (Signed)
..   Encourage patient to contact the pharmacy for refills or they can request refills through [facility]  LAST APPOINTMENT DATE: 07/03/21  NEXT APPOINTMENT DATE:  04/09/22  MEDICATION:  synthroid (is requesting script w/ refills)  Is the patient out of medication?   PHARMACY:  [facility] in [address] [place]   Let patient know to contact pharmacy at the end of the day to make sure medication is ready.  Please notify patient to allow 48-72 hours to process  CLINICAL FILLS OUT ALL BELOW:   LAST REFILL:  QTY:  REFILL DATE:    OTHER COMMENTS:    Okay for refill?  Please advise

## 2022-01-29 ENCOUNTER — Ambulatory Visit (INDEPENDENT_AMBULATORY_CARE_PROVIDER_SITE_OTHER): Payer: 59 | Admitting: Podiatry

## 2022-01-29 ENCOUNTER — Encounter: Payer: Self-pay | Admitting: Podiatry

## 2022-01-29 ENCOUNTER — Other Ambulatory Visit: Payer: Self-pay | Admitting: Family Medicine

## 2022-01-29 DIAGNOSIS — L723 Sebaceous cyst: Secondary | ICD-10-CM | POA: Diagnosis not present

## 2022-01-29 DIAGNOSIS — M779 Enthesopathy, unspecified: Secondary | ICD-10-CM | POA: Diagnosis not present

## 2022-02-01 NOTE — Progress Notes (Signed)
Subjective:   Patient ID: Jerry Owens, male   DOB: 46 y.o.   MRN: [id number]   HPI Patient presents with a small lesion on the medial side of the right first metatarsal that is mildly painful and wanted it checked.  Patient does not smoke likes to be active   Review of Systems  All other systems reviewed and are negative.       Objective:  Physical Exam Vitals and nursing note reviewed.  Constitutional:      Appearance: He is well-developed.  Pulmonary:     Effort: Pulmonary effort is normal.  Musculoskeletal:        General: Normal range of motion.  Skin:    General: Skin is warm.  Neurological:     Mental Status: He is alert.     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  Hopefully it will just resolve gradually on its own

## 2022-03-29 ENCOUNTER — Encounter: Payer: Self-pay | Admitting: *Deleted

## 2022-04-09 ENCOUNTER — Ambulatory Visit (INDEPENDENT_AMBULATORY_CARE_PROVIDER_SITE_OTHER): Payer: 59 | Admitting: Family Medicine

## 2022-04-09 ENCOUNTER — Encounter: Payer: Self-pay | Admitting: Family Medicine

## 2022-04-09 VITALS — BP 136/86 | HR 54 | Temp 97.3°F | Ht 68.0 in | Wt 191.0 lb

## 2022-04-09 DIAGNOSIS — Z800 Family history of malignant neoplasm of digestive organs: Secondary | ICD-10-CM | POA: Diagnosis not present

## 2022-04-09 DIAGNOSIS — Z0001 Encounter for general adult medical examination with abnormal findings: Secondary | ICD-10-CM

## 2022-04-09 DIAGNOSIS — E785 Hyperlipidemia, unspecified: Secondary | ICD-10-CM

## 2022-04-09 DIAGNOSIS — Z1159 Encounter for screening for other viral diseases: Secondary | ICD-10-CM

## 2022-04-09 DIAGNOSIS — L649 Androgenic alopecia, unspecified: Secondary | ICD-10-CM

## 2022-04-09 DIAGNOSIS — E039 Hypothyroidism, unspecified: Secondary | ICD-10-CM

## 2022-04-09 DIAGNOSIS — R739 Hyperglycemia, unspecified: Secondary | ICD-10-CM

## 2022-04-09 DIAGNOSIS — Z125 Encounter for screening for malignant neoplasm of prostate: Secondary | ICD-10-CM | POA: Diagnosis not present

## 2022-04-09 LAB — COMPREHENSIVE METABOLIC PANEL
ALT: 24 U/L (ref 0–53)
AST: 18 U/L (ref 0–37)
Albumin: 4.8 g/dL (ref 3.5–5.2)
Alkaline Phosphatase: 53 U/L (ref 39–117)
BUN: 10 mg/dL (ref 6–23)
CO2: 30 mEq/L (ref 19–32)
Calcium: 9.7 mg/dL (ref 8.4–10.5)
Chloride: 103 mEq/L (ref 96–112)
Creatinine, Ser: 1.1 mg/dL (ref 0.40–1.50)
GFR: 79.25 mL/min (ref >60.00)
Glucose, Bld: 96 mg/dL (ref 70–99)
Potassium: 4.4 mEq/L (ref 3.5–5.1)
Sodium: 142 mEq/L (ref 135–145)
Total Bilirubin: 0.4 mg/dL (ref 0.2–1.2)
Total Protein: 7.5 g/dL (ref 6.0–8.3)

## 2022-04-09 LAB — CBC
HCT: 48.1 % (ref 39.0–52.0)
Hemoglobin: 16.4 g/dL (ref 13.0–17.0)
MCHC: 34.1 g/dL (ref 30.0–36.0)
MCV: 90.9 fl (ref 78.0–100.0)
Platelets: 274 10*3/uL (ref 150.0–400.0)
RBC: 5.29 Mil/uL (ref 4.22–5.81)
RDW: 13.1 % (ref 11.5–15.5)
WBC: 9.7 10*3/uL (ref 4.0–10.5)

## 2022-04-09 LAB — HEMOGLOBIN A1C: Hgb A1c MFr Bld: 5.9 % (ref 4.6–6.5)

## 2022-04-09 LAB — PSA: PSA: 0.43 ng/mL (ref 0.10–4.00)

## 2022-04-09 LAB — LIPID PANEL
Cholesterol: 162 mg/dL (ref 0–200)
HDL: 39.8 mg/dL (ref >39.00)
LDL Cholesterol: 104 mg/dL — ABNORMAL HIGH (ref 0–99)
NonHDL: 122.18
Total CHOL/HDL Ratio: 4
Triglycerides: 93 mg/dL (ref 0.0–149.0)
VLDL: 18.6 mg/dL (ref 0.0–40.0)

## 2022-04-09 LAB — TSH: TSH: 3.22 u[IU]/mL (ref 0.35–5.50)

## 2022-04-09 MED ORDER — LEVOTHYROXINE SODIUM 100 MCG PO TABS: 100.0000 ug | ORAL_TABLET | Freq: Every day | ORAL | 3 refills | Status: DC

## 2022-04-09 MED ORDER — FINASTERIDE 5 MG PO TABS: 5.0000 mg | ORAL_TABLET | Freq: Every day | ORAL | 3 refills | Status: DC

## 2022-04-09 NOTE — Assessment & Plan Note (Signed)
Check lipids 

## 2022-04-09 NOTE — Assessment & Plan Note (Signed)
Check A1c. 

## 2022-04-09 NOTE — Progress Notes (Signed)
Chief Complaint:  Jerry Owens is a 46 y.o. male who presents today for his annual comprehensive physical exam.    Assessment/Plan:  Chronic Problems Addressed Today: Family history of pancreatic cancer Patient's wife concerned about his increased risk for pancreatic cancer.  He has a strong family history of pancreatic cancer in his mother side of the family.  His maternal uncle as well as maternal great uncles have all passed away with pancreatic cancer.  He is interested in possible genetic screening.  We will place referral to oncology to discuss.  Hyperglycemia Check A1c.  Dyslipidemia Check lipids.  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  We will continue.  Tolerating well.  Check labs.  Hypothyroidism On Synthroid 100 mcg daily.  Tolerating well.  Check TSH.  Preventative Healthcare: Check labs today.  Up-to-date on colon cancer screening.  Patient Counseling(The following topics were reviewed and/or handout was given):  -Nutrition: Stressed importance of moderation in sodium/caffeine intake, saturated fat and cholesterol, caloric balance, sufficient intake of fresh fruits, vegetables, and fiber.  -Stressed the importance of regular exercise.   -Substance Abuse: Discussed cessation/primary prevention of tobacco, alcohol, or other drug use; driving or other dangerous activities under the influence; availability of treatment for abuse.   -Injury prevention: Discussed safety belts, safety helmets, smoke detector, smoking near bedding or upholstery.   -Sexuality: Discussed sexually transmitted diseases, partner selection, use of condoms, avoidance of unintended pregnancy and contraceptive alternatives.   -Dental health: Discussed importance of regular tooth brushing, flossing, and dental visits.  -Health maintenance and immunizations reviewed. Please refer to Health maintenance section.  Return to care in 1 year for next preventative visit.     Subjective:   HPI:  He has no acute complaints today.   Lifestyle Diet: None specific.  Exercise: Tries to stay active. Drinks a lot of water.      04/09/2022    8:02 AM  Depression screen PHQ 2/9  Decreased Interest 0  Down, Depressed, Hopeless 0  PHQ - 2 Score 0    Health Maintenance Due  Topic Date Due   COVID-19 Vaccine (1) Never done   Hepatitis C Screening  Never done    ROS: Per HPI, otherwise a complete review of systems was negative.   PMH:  The following were reviewed and entered/updated in epic: Past Medical History:  Diagnosis Date   Complication of anesthesia    hard to wake up   Hypothyroidism    PONV (postoperative nausea and vomiting)    Rupture of right distal biceps tendon    Patient Active Problem List   Diagnosis Date Noted   Family history of pancreatic cancer 04/09/2022   Hyperglycemia 02/26/2020   Dyslipidemia 11/23/2017   Hypothyroidism 07/28/2017   Androgenic alopecia 07/28/2017   Past Surgical History:  Procedure Laterality Date   COLONOSCOPY WITH PROPOFOL N/A 12/22/2015   Procedure: COLONOSCOPY WITH PROPOFOL;  Surgeon: Jerry Bumpers, MD;  Location: [facility];  Service: Endoscopy;  Laterality: N/A;   DISTAL BICEPS TENDON REPAIR Right 05/18/2017   Procedure: RIGHT DISTAL BICEPS REPAIR;  Surgeon: Jerry Kos, MD;  Location: [facility];  Service: Orthopedics;  Laterality: Right;   DISTAL BICEPS TENDON REPAIR Left 05/28/2019   Procedure: LEFT DISTAL BICEPS REPAIR;  Surgeon: Jerry Kos, MD;  Location: [facility];  Service: Orthopedics;  Laterality: Left;   SHOULDER SURGERY     left rotator cuff surgery    Family History  Problem Relation  Age of Onset   Cancer Mother    Heart disease Father    Pancreatic cancer Maternal Grandmother    Cancer Paternal Grandmother    Stroke Paternal Grandmother    Pancreatic cancer Maternal Uncle     Medications- reviewed and updated Current Outpatient Medications   Medication Sig Dispense Refill   Ascorbic Acid (VITAMIN C) 100 MG tablet Take 100 mg by mouth daily.     finasteride (PROSCAR) 5 MG tablet Take 1 tablet (5 mg total) by mouth daily. 90 tablet 3   levothyroxine (SYNTHROID) 100 MCG tablet Take 1 tablet (100 mcg total) by mouth daily. 90 tablet 3   No current facility-administered medications for this visit.    Allergies-reviewed and updated Allergies  Allergen Reactions   Amoxicillin Anaphylaxis    Swelling in face, neck, ears, and chest   Penicillins Anaphylaxis    facial and neck swelling    Social History   Socioeconomic History   Marital status: Married    Spouse name: Not on file   Number of children: 2   Years of education: Not on file   Highest education level: Not on file  Occupational History   Not on file  Tobacco Use   Smoking status: Former    Types: Cigarettes    Quit date: 11/21/2004    Years since quitting: 17.3   Smokeless tobacco: Never  Vaping Use   Vaping Use: Never used  Substance and Sexual Activity   Alcohol use: Yes    Comment: social   Drug use: No   Sexual activity: Yes  Other Topics Concern   Not on file  Social History Narrative   Not on file   Social Determinants of Health   Financial Resource Strain: Not on file  Food Insecurity: Not on file  Transportation Needs: Not on file  Physical Activity: Not on file  Stress: Not on file  Social Connections: Not on file        Objective:  Physical Exam: BP 136/86   Pulse (!) 54   Temp (!) 97.3 F (36.3 C) (Temporal)   Ht 5\' 8"  (1.727 m)   Wt 191 lb (86.6 kg)   SpO2 98%   BMI 29.04 kg/m   Body mass index is 29.04 kg/m. Wt Readings from Last 3 Encounters:  04/09/22 191 lb (86.6 kg)  04/07/21 192 lb (87.1 kg)  02/26/20 188 lb (85.3 kg)   Gen: NAD, resting comfortably HEENT: TMs normal bilaterally. OP clear. No thyromegaly noted.  CV: RRR with no murmurs appreciated Pulm: NWOB, CTAB with no crackles, wheezes, or rhonchi GI:  Normal bowel sounds present. Soft, Nontender, Nondistended. MSK: no edema, cyanosis, or clubbing noted Skin: warm, dry Neuro: CN2-12 grossly intact. Strength 5/5 in upper and lower extremities. Reflexes symmetric and intact bilaterally.  Psych: Normal affect and thought content     Jerry Owens M. Jerry Pain, MD 04/09/2022 8:41 AM

## 2022-04-09 NOTE — Assessment & Plan Note (Signed)
On Synthroid 100 mcg daily.  Tolerating well.  Check TSH.

## 2022-04-09 NOTE — Assessment & Plan Note (Signed)
Patient's wife concerned about his increased risk for pancreatic cancer.  He has a strong family history of pancreatic cancer in his mother side of the family.  His maternal uncle as well as maternal great uncles have all passed away with pancreatic cancer.  He is interested in possible genetic screening.  We will place referral to oncology to discuss.

## 2022-04-09 NOTE — Assessment & Plan Note (Signed)
On finasteride 5 mg daily.  We will continue.  Tolerating well.  Check labs.

## 2022-04-09 NOTE — Patient Instructions (Signed)
It was very nice to see you today!  We will check blood work today.  I will refer you to the oncologist to discuss screening for pancreatic cancer.  Please continue to work on diet and exercise.  I will see back in a year for your next physical.  Please come back to see me sooner if needed.  Take care, Dr Jerline Pain  PLEASE NOTE:  If you had any lab tests please let us know if you have not heard back within a few days. You may see your results on mychart before we have a chance to review them but we will give you a call once they are reviewed by [place]. If we ordered any referrals today, please let us know if you have not heard from their office within the next week.   Please try these tips to maintain a healthy lifestyle:  Eat at least 3 REAL meals and 1-2 snacks per day.  Aim for no more than 5 hours between eating.  If you eat breakfast, please do so within one hour of getting up.   Each meal should contain half fruits/vegetables, one quarter protein, and one quarter carbs (no bigger than a computer mouse)  Cut down on sweet beverages. This includes juice, soda, and sweet tea.   Drink at least 1 glass of water with each meal and aim for at least 8 glasses per day  Exercise at least 150 minutes every week.    Preventive Care 7-86 Years Old, Male Preventive care refers to lifestyle choices and visits with your health care provider that can promote health and wellness. Preventive care visits are also called wellness exams. What can I expect for my preventive care visit? Counseling During your preventive care visit, your health care provider may ask about your: Medical history, including: Past medical problems. Family medical history. Current health, including: Emotional well-being. Home life and relationship well-being. Sexual activity. Lifestyle, including: Alcohol, nicotine or tobacco, and drug use. Access to firearms. Diet, exercise, and sleep habits. Safety issues such as  seatbelt and bike helmet use. Sunscreen use. Work and work Statistician. Physical exam Your health care provider will check your: Height and weight. These may be used to calculate your BMI (body mass index). BMI is a measurement that tells if you are at a healthy weight. Waist circumference. This measures the distance around your waistline. This measurement also tells if you are at a healthy weight and may help predict your risk of certain diseases, such as type 2 diabetes and high blood pressure. Heart rate and blood pressure. Body temperature. Skin for abnormal spots. What immunizations do I need?  Vaccines are usually given at various ages, according to a schedule. Your health care provider will recommend vaccines for you based on your age, medical history, and lifestyle or other factors, such as travel or where you work. What tests do I need? Screening Your health care provider may recommend screening tests for certain conditions. This may include: Lipid and cholesterol levels. Diabetes screening. This is done by checking your blood sugar (glucose) after you have not eaten for a while (fasting). Hepatitis B test. Hepatitis C test. HIV (human immunodeficiency virus) test. STI (sexually transmitted infection) testing, if you are at risk. Lung cancer screening. Prostate cancer screening. Colorectal cancer screening. Talk with your health care provider about your test results, treatment options, and if necessary, the need for more tests. Follow these instructions at home: Eating and drinking  Eat a diet that includes fresh  fruits and vegetables, whole grains, lean protein, and low-fat dairy products. Take vitamin and mineral supplements as recommended by your health care provider. Do not drink alcohol if your health care provider tells you not to drink. If you drink alcohol: Limit how much you have to 0-2 drinks a day. Know how much alcohol is in your drink. In the [place] one drink  equals one 12 oz bottle of beer (355 mL), one 5 oz glass of wine (148 mL), or one 1 oz glass of hard liquor (44 mL). Lifestyle Brush your teeth every morning and night with fluoride toothpaste. Floss one time each day. Exercise for at least 30 minutes 5 or more days each week. Do not use any products that contain nicotine or tobacco. These products include cigarettes, chewing tobacco, and vaping devices, such as e-cigarettes. If you need help quitting, ask your health care provider. Do not use drugs. If you are sexually active, practice safe sex. Use a condom or other form of protection to prevent STIs. Take aspirin only as told by your health care provider. Make sure that you understand how much to take and what form to take. Work with your health care provider to find out whether it is safe and beneficial for you to take aspirin daily. Find healthy ways to manage stress, such as: Meditation, yoga, or listening to music. Journaling. Talking to a trusted person. Spending time with friends and family. Minimize exposure to UV radiation to reduce your risk of skin cancer. Safety Always wear your seat belt while driving or riding in a vehicle. Do not drive: If you have been drinking alcohol. Do not ride with someone who has been drinking. When you are tired or distracted. While texting. If you have been using any mind-altering substances or drugs. Wear a helmet and other protective equipment during sports activities. If you have firearms in your house, make sure you follow all gun safety procedures. What's next? Go to your health care provider once a year for an annual wellness visit. Ask your health care provider how often you should have your eyes and teeth checked. Stay up to date on all vaccines. This information is not intended to replace advice given to you by your health care provider. Make sure you discuss any questions you have with your health care provider. Document Revised:  12/17/2020 Document Reviewed: 12/17/2020 Elsevier Patient Education  [place].

## 2022-04-12 ENCOUNTER — Telehealth: Payer: Self-pay | Admitting: Genetic Counselor

## 2022-04-12 LAB — HEPATITIS C ANTIBODY: Hepatitis C Ab: NONREACTIVE

## 2022-04-12 NOTE — Telephone Encounter (Signed)
Scheduled appt per 10/6 referral. Pt is aware of appt date and time. Pt is aware to arrive 15 mins prior to appt time and to bring and updated insurance card. Pt is aware of appt location.   

## 2022-04-13 NOTE — Progress Notes (Signed)
Please inform patient of the following:  Labs are all stable.  Do not need to make any change to the treatment plan at this time.  He should continue to work on diet and exercise and we can recheck in a year.

## 2022-06-11 ENCOUNTER — Other Ambulatory Visit: Payer: Self-pay | Admitting: Genetic Counselor

## 2022-06-11 DIAGNOSIS — Z800 Family history of malignant neoplasm of digestive organs: Secondary | ICD-10-CM

## 2022-06-11 NOTE — Progress Notes (Unsigned)
REFERRING PROVIDER: Ardith Dark, MD [address] [place],  [place] [address]  PRIMARY PROVIDER:  Ardith Dark, MD  PRIMARY REASON FOR VISIT:  No diagnosis found.   HISTORY OF PRESENT ILLNESS:   Jerry Owens, a 46 y.o. male, was seen for a [facility] cancer genetics consultation at the request of Dr. [name] due to a family history of pancreatic cancer.  Jerry Owens presents to clinic today to discuss the possibility of a hereditary predisposition to cancer, to discuss genetic testing, and to further clarify his future cancer risks, as well as potential cancer risks for family members.   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 The treatment plan ***.    *** Jerry Owens is a 46 y.o. male with no personal history of cancer.    CANCER HISTORY:  Oncology History   No history exists.     RISK FACTORS:  Colonoscopy: yes;  most recent in 2017; repeat in 10 years . PSA screening: most recent 04/2022 Dermatology screening: ***  Past Medical History:  Diagnosis Date   Complication of anesthesia    hard to wake up   Hypothyroidism    PONV (postoperative nausea and vomiting)    Rupture of right distal biceps tendon     Past Surgical History:  Procedure Laterality Date   COLONOSCOPY WITH PROPOFOL N/A 12/22/2015   Procedure: COLONOSCOPY WITH PROPOFOL;  Surgeon: Charolett Bumpers, MD;  Location: [facility];  Service: Endoscopy;  Laterality: N/A;   DISTAL BICEPS TENDON REPAIR Right 05/18/2017   Procedure: RIGHT DISTAL BICEPS REPAIR;  Surgeon: Tarry Kos, MD;  Location: [facility];  Service: Orthopedics;  Laterality: Right;   DISTAL BICEPS TENDON REPAIR Left 05/28/2019   Procedure: LEFT DISTAL BICEPS REPAIR;  Surgeon: Tarry Kos, MD;  Location: [facility];  Service: Orthopedics;  Laterality: Left;   SHOULDER SURGERY     left rotator cuff surgery        FAMILY HISTORY:  We obtained a detailed, 4-generation family history.  Significant diagnoses are listed below: Family History  Problem Relation Age of Onset   Cancer Mother    Heart disease Father    Pancreatic cancer Maternal Grandmother    Cancer Paternal Grandmother    Stroke Paternal Grandmother    Pancreatic cancer Maternal Uncle     Jerry Owens is {aware/unaware} of previous family history of genetic testing for hereditary cancer risks. Patient's maternal ancestors are of *** descent, and paternal ancestors are of *** descent. There {IS NO:12509} reported Ashkenazi Jewish ancestry. There {IS NO:12509} known consanguinity.  GENETIC COUNSELING ASSESSMENT: Jerry Owens is a 46 y.o. male with a {Personal/family:20331} history of {cancer/polyps} which is somewhat suggestive of a {DISEASE} and predisposition to cancer given ***. We, therefore, discussed and recommended the following at today's visit.   DISCUSSION: We discussed that *** - ***% of *** is hereditary, with most cases of hereditary *** cancer associated with ***.  There are other genes that can be associated with hereditary *** cancer syndromes.  These include ***.  We discussed that testing is beneficial for several reasons, including knowing about other cancer risks, identifying potential screening and risk-reduction options that may be appropriate, and to understanding if other family members could be at risk for cancer and allowing them to undergo genetic testing.  We reviewed the characteristics, features and inheritance patterns of hereditary cancer syndromes. We also  discussed genetic testing, including the appropriate family members to test, the process of testing, insurance coverage and turn-around-time for results. We discussed the implications of a negative, positive, and variant of uncertain significant result. ***We discussed that negative results would be uninformative given that Jerry Owens does not have a personal  history of cancer. We recommended Jerry Owens pursue genetic testing for a panel that contains genes associated with ***.  Jerry Owens was offered a common hereditary cancer panel (48 genes) and an expanded pan-cancer panel (85 genes). Jerry Owens was informed of the benefits and limitations of each panel, including that expanded pan-cancer panels contain several genes that do not have clear management guidelines at this point in time.  We also discussed that as the number of genes included on a panel increases, the chances of variants of uncertain significance increases.  After considering the benefits and limitations of each gene panel, Jerry Owens elected to have an *** through ***.   Based on Jerry Owens {Personal/family:20331} history of cancer, he meets medical criteria for genetic testing. Despite that he meets criteria, he may still have an out of pocket cost. We discussed that if his out of pocket cost for testing is over $100, the laboratory should contact them to discuss self-pay options and/or patient pay assistance programs.   ***We reviewed the characteristics, features and inheritance patterns of hereditary cancer syndromes. We also discussed genetic testing, including the appropriate family members to test, the process of testing, insurance coverage and turn-around-time for results. We discussed the implications of a negative, positive and/or variant of uncertain significant result. In order to get genetic test results in a timely manner so that Jerry Owens can use these genetic test results for surgical decisions, we recommended Jerry Owens pursue genetic testing for the ***. Once complete, we recommend Jerry Owens pursue reflex genetic testing to the *** gene panel.   Based on Jerry Owens {Personal/family:20331} history of cancer, he meets medical criteria for genetic testing. Despite that he meets criteria, he may still have an out of pocket cost.   ***We discussed with Jerry Owens that the  {Personal/family:20331} history does not meet insurance or NCCN criteria for genetic testing and, therefore, is not highly consistent with a familial hereditary cancer syndrome.  We feel he is at low risk to harbor a gene mutation associated with such a condition. Thus, we did not recommend any genetic testing, at this time, and recommended Jerry Owens continue to follow the cancer screening guidelines given by his primary healthcare provider.  ***In order to estimate his chance of having a {CA GENE:62345} mutation, we used statistical models ({GENMODELS:62370}) that consider his personal medical history, family history and ancestry.  Because each model is different, there can be a lot of variability in the risks they give.  Therefore, these numbers must be considered a rough range and not a precise risk of having a {CA GENE:62345} mutation.  These models estimate that she has approximately a ***-***% chance of having a mutation. Based on this assessment of her family and personal history, genetic testing {IS/ISNOT:34056} recommended.  ***Based on the patient's {Personal/family:20331} history, a statistical model ({GENMODELS:62370}) was used to estimate his risk of developing {CA HX:54794}. This estimates his lifetime risk of developing {CA HX:54794} to be approximately ***%. This estimation does not consider any genetic testing results.  The patient's lifetime breast cancer risk is a preliminary estimate based on available information using one of several models endorsed by the American Cancer Society (ACS).  The ACS recommends consideration of breast MRI screening as an adjunct to mammography for patients at high risk (defined as 20% or greater lifetime risk).   ***Mr. Platten has been determined to be at high risk for breast cancer.  Therefore, we recommend that annual screening with mammography and breast MRI be performed.  ***begin at age 69, or 10 years prior to the age of breast cancer diagnosis in a  relative (whichever is earlier).  We discussed that Mr. Costlow should discuss her individual situation with her referring physician and determine a breast cancer screening plan with which they are both comfortable.    We discussed the Genetic Information Non-Discrimination Act (GINA) of 2008, which helps protect individuals against genetic discrimination based on their genetic test results.  It impacts both health insurance and employment.  With health insurance, it protects against genetic test results being used for increased premiums or policy termination. For employment, it protects against hiring, firing and promoting decisions based on genetic test results.  GINA does not apply to those in the [employer], those who work for companies with less than 15 employees, and new life insurance or long-term disability insurance policies.  Health status due to a cancer diagnosis is not protected under GINA.  PLAN: After considering the risks, benefits, and limitations, Mr. Hedden provided informed consent to pursue genetic testing and the blood sample was sent to {Lab} Laboratories for analysis of the {test}. Results should be available within approximately {TAT TIME} weeks' time, at which point they will be disclosed by telephone to Mr. Bernards, as will any additional recommendations warranted by these results. Mr. Bartolomei will receive a summary of his genetic counseling visit and a copy of his results once available. This information will also be available in Epic.   *** Despite our recommendation, Mr. Huitron did not wish to pursue genetic testing at today's visit. We understand this decision and remain available to coordinate genetic testing at any time in the future. We, therefore, recommend Mr. Rivest continue to follow the cancer screening guidelines given by his primary healthcare provider.  ***Based on Mr. Convey family history, we recommended his ***, who was diagnosed with *** at age ***, have genetic  counseling and testing. Mr. Ealy will let us know if we can be of any assistance in coordinating genetic counseling and/or testing for this family member.   Lastly, we encouraged Mr. Alfieri to remain in contact with cancer genetics annually so that we can continuously update the family history and inform him of any changes in cancer genetics and testing that may be of benefit for this family.   Mr. Din questions were answered to his satisfaction today. Our contact information was provided should additional questions or concerns arise. ***Thank you for the referral and allowing [place] to share in the care of your patient.   Alix Lahmann M. Rennie Plowman, MS, [facility] Genetic Counselor [email or web address] (P) [phone]   The patient was seen for a total of *** minutes in face-to-face genetic counseling.  ***The was patient was accompanied by ***.  ***The patient was seen alone.  Drs. Pamelia Hoit and/or Mosetta Putt were available to discuss this case as needed.  _______________________________________________________________________ For Office Staff:  Number of people involved in session: *** Was an Intern/ student involved with case: {YES/NO:63}

## 2022-06-14 ENCOUNTER — Inpatient Hospital Stay: Payer: 59

## 2022-06-14 ENCOUNTER — Inpatient Hospital Stay: Payer: 59 | Attending: Genetic Counselor | Admitting: Genetic Counselor

## 2022-06-14 DIAGNOSIS — Z800 Family history of malignant neoplasm of digestive organs: Secondary | ICD-10-CM

## 2022-06-14 DIAGNOSIS — Z803 Family history of malignant neoplasm of breast: Secondary | ICD-10-CM | POA: Diagnosis not present

## 2022-06-14 LAB — GENETIC SCREENING ORDER

## 2022-06-15 ENCOUNTER — Encounter: Payer: Self-pay | Admitting: Genetic Counselor

## 2022-06-24 ENCOUNTER — Encounter: Payer: Self-pay | Admitting: Genetic Counselor

## 2022-06-24 ENCOUNTER — Telehealth: Payer: Self-pay | Admitting: Genetic Counselor

## 2022-06-24 ENCOUNTER — Ambulatory Visit: Payer: Self-pay | Admitting: Genetic Counselor

## 2022-06-24 DIAGNOSIS — Z803 Family history of malignant neoplasm of breast: Secondary | ICD-10-CM

## 2022-06-24 DIAGNOSIS — Z1379 Encounter for other screening for genetic and chromosomal anomalies: Secondary | ICD-10-CM

## 2022-06-24 DIAGNOSIS — Z800 Family history of malignant neoplasm of digestive organs: Secondary | ICD-10-CM

## 2022-06-24 NOTE — Telephone Encounter (Signed)
Revealed negative genetic testing.  Patient interested in referral for pancreatic cancer screening consultation.

## 2022-06-24 NOTE — Telephone Encounter (Signed)
error 

## 2022-07-26 NOTE — Progress Notes (Signed)
HPI:   [name] was previously seen in the [facility] clinic due to a family history of pancreatic cancer and concerns regarding a hereditary predisposition to cancer. Please refer to our prior cancer genetics clinic note for more information regarding our discussion, assessment and recommendations, at the time. Jerry Owens recent genetic test results were disclosed to him, as were recommendations warranted by these results. These results and recommendations are discussed in more detail below.  CANCER HISTORY:  Oncology History   No history exists.    FAMILY HISTORY:  We obtained a detailed, 4-generation family history.  Significant diagnoses are listed below:        Family History  Problem Relation Age of Onset   Breast cancer Mother 84   Pancreatic cancer Maternal Uncle 55   Throat cancer Maternal Uncle          dx after 13   Pancreatic cancer Maternal Grandmother          dx early 71s   Breast cancer Paternal Grandmother          dx late 50s-early 60s; mets   Pancreatic cancer Other          MGM's siblings x3   Breast cancer Other          MGM's sister     Jerry Owens is unaware of previous family history of genetic testing for hereditary cancer risks. Affected relatives are unavailable for genetic testing at this time. There is no reported Ashkenazi Jewish ancestry. There is no known consanguinity.  GENETIC TEST RESULTS:  The [facility] Multli-Cancer +RNA Panel and Chronic Pancreatitis Panel found no pathogenic mutations.   The Multi-Cancer + RNA Panel offered by [facility] includes sequencing and/or deletion/duplication analysis of the following 70 genes:  AIP*, ALK, APC*, ATM*, AXIN2*, BAP1*, BARD1*, BLM*, BMPR1A*, BRCA1*, BRCA2*, BRIP1*, CDC73*, CDH1*, CDK4, CDKN1B*, CDKN2A, CHEK2*, CTNNA1*, DICER1*, EPCAM (del/dup only), EGFR, FH*, FLCN*, GREM1 (promoter dup only), HOXB13, KIT, LZTR1, MAX*, MBD4, MEN1*, MET, MITF, MLH1*, MSH2*, MSH3*, MSH6*, MUTYH*, NF1*, NF2*,  NTHL1*, PALB2*, PDGFRA, PMS2*, POLD1*, POLE*, POT1*, PRKAR1A*, PTCH1*, PTEN*, RAD51C*, RAD51D*, RB1*, RET, SDHA* (sequencing only), SDHAF2*, SDHB*, SDHC*, SDHD*, SMAD4*, SMARCA4*, SMARCB1*, SMARCE1*, STK11*, SUFU*, TMEM127*, TP53*, TSC1*, TSC2*, VHL*. RNA analysis is performed for * genes.  The [facility] Chronic Pancreatitis Panel includes sequencing and deletion/duplicaiton analysis of the following 7 genes: CASR, CFTR, CPA1, CTRC, PRSS1, SPINK1, TRPV6.   The test report has been scanned into EPIC and is located under the Molecular Pathology section of the Results Review tab.  A portion of the result report is included below for reference. Genetic testing reported out on June 21, 2022.     Even though a pathogenic variant was not identified, possible explanations for the cancer in the family may include: There may be no hereditary risk for cancer in the family. The cancers in Jerry Owens family may be sporadic/familial or due to other genetic and environmental factors. There may be a gene mutation in one of these genes that current testing methods cannot detect but that chance is small. There could be another gene that has not yet been discovered, or that we have not yet tested, that is responsible for the cancer diagnoses in the family.  It is also possible there is a hereditary cause for the cancer in the family that Jerry Owens did not inherit.  Therefore, it is important to remain in touch with cancer genetics in the future so that we can continue to  offer Jerry Owens the most up to date genetic testing.    ADDITIONAL GENETIC TESTING:  We discussed with Jerry Owens that his genetic testing was fairly extensive.  If there are additional relevant genes identified to increase cancer risk that can be analyzed in the future, we would be happy to discuss and coordinate this testing at that time.     CANCER SCREENING RECOMMENDATIONS:  Jerry Owens test result is considered negative (normal).  This  means that we have not identified a hereditary cause for his family history of cancer at this time.   An individual's cancer risk and medical management are not determined by genetic test results alone. Overall cancer risk assessment incorporates additional factors, including personal medical history, family history, and any available genetic information that may result in a personalized plan for cancer prevention and surveillance. Therefore, it is recommended he continue to follow the cancer management and screening guidelines provided by his primary healthcare provider.  Based on the reported personal and family history, specific cancer screenings for Jerry Owens include:  Consideration for pancreatic cancer screening Jerry Owens has a family history of pancreatic cancer in two second degree relatives (maternal uncle and maternal grandmother) and three third degree relatives (maternal great aunt/uncles).  While Jerry Owens does not meet CAPS guidelines for pancreatic cancer screening, he does have an extensive family history of pancreatic cancer in multiple generations in his maternal family. We recommended that he consider a consultation with Dr. Bryan Lemma at [facility] for discussion of appropriate screening options given his family history of pancreatic cancer.  Referral placed.    RECOMMENDATIONS FOR FAMILY MEMBERS:   Since he did not inherit a identifiable mutation in a cancer predisposition gene included on this panel, his children could not have inherited a known mutation from him in one of these genes. Individuals in this family might be at some increased risk of developing cancer, over the general population risk, due to the family history of cancer.  Individuals in the family should notify their providers of the family history of cancer. We recommend women in this family have a yearly mammogram beginning at age 36, or 88 years younger than the earliest onset of cancer, an annual  clinical breast exam, and perform monthly breast self-exams.  We discussed that other relatives, like his mother, may be candidates to consider pancreatic cancer screening based on family history.  Other members of the family may still carry a pathogenic variant in one of these genes that Jerry Owens did not inherit.  Genetic testing of family members can better help interpret Jerry Owens negative results. Based on the family history, we recommend his mother, who has a history of breast cancer, have genetic counseling and testing.  Jerry Owens will let us know if we can be of any assistance in coordinating genetic counseling and/or testing for this family member.    FOLLOW-UP:  Lastly, we discussed with Jerry Owens that cancer genetics is a rapidly advancing field and it is possible that new genetic tests will be appropriate for him and/or his family members in the future. We encouraged him to remain in contact with cancer genetics on an annual basis so we can update his personal and family histories and let him know of advances in cancer genetics that may benefit this family.   Our contact number was provided. Jerry Owens questions were answered to his satisfaction, and he knows he is welcome to call us at anytime with additional questions  or concerns.   Ariam Mol M. Joette Catching, [place], [facility] Genetic Counselor [email or web address] ([phone]

## 2023-03-23 ENCOUNTER — Other Ambulatory Visit: Payer: Self-pay | Admitting: Family Medicine

## 2023-04-11 ENCOUNTER — Encounter: Payer: 59 | Admitting: Family Medicine

## 2023-06-24 ENCOUNTER — Other Ambulatory Visit: Payer: Self-pay | Admitting: Family Medicine

## 2023-07-04 ENCOUNTER — Ambulatory Visit (INDEPENDENT_AMBULATORY_CARE_PROVIDER_SITE_OTHER): Payer: 59 | Admitting: Family Medicine

## 2023-07-04 ENCOUNTER — Encounter: Payer: Self-pay | Admitting: Family Medicine

## 2023-07-04 VITALS — BP 132/84 | HR 66 | Temp 97.5°F | Ht 68.0 in | Wt 200.0 lb

## 2023-07-04 DIAGNOSIS — E039 Hypothyroidism, unspecified: Secondary | ICD-10-CM

## 2023-07-04 DIAGNOSIS — E785 Hyperlipidemia, unspecified: Secondary | ICD-10-CM | POA: Diagnosis not present

## 2023-07-04 DIAGNOSIS — M79641 Pain in right hand: Secondary | ICD-10-CM

## 2023-07-04 DIAGNOSIS — M79643 Pain in unspecified hand: Secondary | ICD-10-CM | POA: Insufficient documentation

## 2023-07-04 DIAGNOSIS — R739 Hyperglycemia, unspecified: Secondary | ICD-10-CM

## 2023-07-04 DIAGNOSIS — Z0001 Encounter for general adult medical examination with abnormal findings: Secondary | ICD-10-CM | POA: Diagnosis not present

## 2023-07-04 DIAGNOSIS — L649 Androgenic alopecia, unspecified: Secondary | ICD-10-CM | POA: Diagnosis not present

## 2023-07-04 DIAGNOSIS — Z125 Encounter for screening for malignant neoplasm of prostate: Secondary | ICD-10-CM | POA: Diagnosis not present

## 2023-07-04 LAB — COMPREHENSIVE METABOLIC PANEL
ALT: 61 U/L — ABNORMAL HIGH (ref 0–53)
AST: 26 U/L (ref 0–37)
Albumin: 4.6 g/dL (ref 3.5–5.2)
Alkaline Phosphatase: 55 U/L (ref 39–117)
BUN: 8 mg/dL (ref 6–23)
CO2: 32 meq/L (ref 19–32)
Calcium: 9.4 mg/dL (ref 8.4–10.5)
Chloride: 103 meq/L (ref 96–112)
Creatinine, Ser: 0.99 mg/dL (ref 0.40–1.50)
GFR: 89.16 mL/min (ref >60.00)
Glucose, Bld: 111 mg/dL — ABNORMAL HIGH (ref 70–99)
Potassium: 5 meq/L (ref 3.5–5.1)
Sodium: 144 meq/L (ref 135–145)
Total Bilirubin: 0.3 mg/dL (ref 0.2–1.2)
Total Protein: 7.3 g/dL (ref 6.0–8.3)

## 2023-07-04 LAB — LIPID PANEL
Cholesterol: 219 mg/dL — ABNORMAL HIGH (ref 0–200)
HDL: 44.4 mg/dL (ref >39.00)
LDL Cholesterol: 148 mg/dL — ABNORMAL HIGH (ref 0–99)
NonHDL: 174.51
Total CHOL/HDL Ratio: 5
Triglycerides: 132 mg/dL (ref 0.0–149.0)
VLDL: 26.4 mg/dL (ref 0.0–40.0)

## 2023-07-04 LAB — CBC
HCT: 50.9 % (ref 39.0–52.0)
Hemoglobin: 17.2 g/dL — ABNORMAL HIGH (ref 13.0–17.0)
MCHC: 33.7 g/dL (ref 30.0–36.0)
MCV: 92.9 fL (ref 78.0–100.0)
Platelets: 301 10*3/uL (ref 150.0–400.0)
RBC: 5.48 Mil/uL (ref 4.22–5.81)
RDW: 13.5 % (ref 11.5–15.5)
WBC: 9.3 10*3/uL (ref 4.0–10.5)

## 2023-07-04 LAB — TSH: TSH: 7.45 u[IU]/mL — ABNORMAL HIGH (ref 0.35–5.50)

## 2023-07-04 LAB — HEMOGLOBIN A1C: Hgb A1c MFr Bld: 6.1 % (ref 4.6–6.5)

## 2023-07-04 LAB — PSA: PSA: 0.56 ng/mL (ref 0.10–4.00)

## 2023-07-04 MED ORDER — DICLOFENAC SODIUM 75 MG PO TBEC: 75.0000 mg | DELAYED_RELEASE_TABLET | Freq: Two times a day (BID) | ORAL | 0 refills | Status: AC

## 2023-07-04 MED ORDER — LEVOTHYROXINE SODIUM 100 MCG PO TABS: 100.0000 ug | ORAL_TABLET | Freq: Every day | ORAL | 0 refills | Status: DC

## 2023-07-04 MED ORDER — FINASTERIDE 5 MG PO TABS: 5.0000 mg | ORAL_TABLET | Freq: Every day | ORAL | 3 refills | Status: AC

## 2023-07-04 NOTE — Assessment & Plan Note (Signed)
On finasteride 5 mg daily and tolerating well.  Will check labs today including PSA.

## 2023-07-04 NOTE — Assessment & Plan Note (Signed)
Check A1c.  Discussed lifestyle modifications. °

## 2023-07-04 NOTE — Assessment & Plan Note (Signed)
Exam and history consistent with OA.  We discussed management strategies.  Recommended against daily NSAID use at this point due to concern for side effects.  It is okay for him to continue with topical Voltaren daily.  Also recommended Tylenol 1000 mg 3 times daily as needed.  Will send a prescription in for diclofenac 75 mg twice daily as needed though as above discussed that he should try to limit this to no more than a few times per week.  We did discuss referral to PT or sports medicine however he declined.  He will let us know how he is doing in a few weeks.  If no improvement with this regimen we will refer.

## 2023-07-04 NOTE — Assessment & Plan Note (Signed)
Check lipids. Discussed lifestyle modifications.  

## 2023-07-04 NOTE — Progress Notes (Signed)
Chief Complaint:  Jerry Owens is a 47 y.o. male who presents today for his annual comprehensive physical exam.    Assessment/Plan:  Chronic Problems Addressed Today: Hand pain Exam and history consistent with OA.  We discussed management strategies.  Recommended against daily NSAID use at this point due to concern for side effects.  It is okay for him to continue with topical Voltaren daily.  Also recommended Tylenol 1000 mg 3 times daily as needed.  Will send a prescription in for diclofenac 75 mg twice daily as needed though as above discussed that he should try to limit this to no more than a few times per week.  We did discuss referral to PT or sports medicine however he declined.  He will let us know how he is doing in a few weeks.  If no improvement with this regimen we will refer.   Hypothyroidism Doing well on Synthroid 100 mcg daily.  Check TSH.  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  Will check labs today including PSA.   Dyslipidemia Check lipids.  Discussed lifestyle modifications.  Hyperglycemia Check A1c.  Discussed lifestyle modifications.   Preventative Healthcare: Check labs.  Flu vaccine declined.  Due for colonoscopy in 2027.  Patient Counseling(The following topics were reviewed and/or handout was given):  -Nutrition: Stressed importance of moderation in sodium/caffeine intake, saturated fat and cholesterol, caloric balance, sufficient intake of fresh fruits, vegetables, and fiber.  -Stressed the importance of regular exercise.   -Substance Abuse: Discussed cessation/primary prevention of tobacco, alcohol, or other drug use; driving or other dangerous activities under the influence; availability of treatment for abuse.   -Injury prevention: Discussed safety belts, safety helmets, smoke detector, smoking near bedding or upholstery.   -Sexuality: Discussed sexually transmitted diseases, partner selection, use of condoms, avoidance  of unintended pregnancy and contraceptive alternatives.   -Dental health: Discussed importance of regular tooth brushing, flossing, and dental visits.  -Health maintenance and immunizations reviewed. Please refer to Health maintenance section.  Return to care in 1 year for next preventative visit.     Subjective:  HPI:  See Assessment / plan for status of chronic conditions.   He is having more pain in his right second finger. This has been going on for months to years. Does a lot of repetitive motions with hands for work. No obvious injuries or precipitating events. Tried Voltaren with modest improvement. No redness. Some swelling. Comes and goes. Pain seems to worse at night. Some stiffness in the morning as well.   Lifestyle Diet: Trying to get more fruits and vegetables.  Exercise: Very active around the house.      07/04/2023    8:19 AM  Depression screen PHQ 2/9  Decreased Interest 0  Down, Depressed, Hopeless 0  PHQ - 2 Score 0   There are no preventive care reminders to display for this patient.   ROS: Per HPI, otherwise a complete review of systems was negative.   PMH:  The following were reviewed and entered/updated in epic: Past Medical History:  Diagnosis Date   Complication of anesthesia    hard to wake up   Hypothyroidism    PONV (postoperative nausea and vomiting)    Rupture of right distal biceps tendon    Patient Active Problem List   Diagnosis Date Noted   Hand pain 07/04/2023   Genetic testing 06/24/2022   Family history of pancreatic cancer 04/09/2022   Hyperglycemia 02/26/2020   Dyslipidemia 11/23/2017  Hypothyroidism 07/28/2017   Androgenic alopecia 07/28/2017   Past Surgical History:  Procedure Laterality Date   COLONOSCOPY WITH PROPOFOL N/A 12/22/2015   Procedure: COLONOSCOPY WITH PROPOFOL;  Surgeon: Charolett Bumpers, MD;  Location: [facility];  Service: Endoscopy;  Laterality: N/A;   DISTAL BICEPS TENDON REPAIR Right 05/18/2017    Procedure: RIGHT DISTAL BICEPS REPAIR;  Surgeon: Tarry Kos, MD;  Location: [facility];  Service: Orthopedics;  Laterality: Right;   DISTAL BICEPS TENDON REPAIR Left 05/28/2019   Procedure: LEFT DISTAL BICEPS REPAIR;  Surgeon: Tarry Kos, MD;  Location: [facility];  Service: Orthopedics;  Laterality: Left;   SHOULDER SURGERY     left rotator cuff surgery    Family History  Problem Relation Age of Onset   Breast cancer Mother 12   Heart disease Father    Pancreatic cancer Maternal Uncle 55   Throat cancer Maternal Uncle        dx after 44   Pancreatic cancer Maternal Grandmother        dx early 5s   Breast cancer Paternal Grandmother        dx late 50s-early 60s; mets   Stroke Paternal Grandmother    Pancreatic cancer Other        MGM's siblings x3   Breast cancer Other        MGM's sister    Medications- reviewed and updated Current Outpatient Medications  Medication Sig Dispense Refill   Ascorbic Acid (VITAMIN C) 100 MG tablet Take 100 mg by mouth daily.     diclofenac (VOLTAREN) 75 MG EC tablet Take 1 tablet (75 mg total) by mouth 2 (two) times daily. 30 tablet 0   finasteride (PROSCAR) 5 MG tablet Take 1 tablet (5 mg total) by mouth daily. 90 tablet 3   levothyroxine (SYNTHROID) 100 MCG tablet Take 1 tablet (100 mcg total) by mouth daily. 90 tablet 0   No current facility-administered medications for this visit.    Allergies-reviewed and updated Allergies  Allergen Reactions   Amoxicillin Anaphylaxis    Swelling in face, neck, ears, and chest   Penicillins Anaphylaxis    facial and neck swelling    Social History   Socioeconomic History   Marital status: Married    Spouse name: Not on file   Number of children: 2   Years of education: Not on file   Highest education level: Not on file  Occupational History   Not on file  Tobacco Use   Smoking status: Former    Current packs/day: 0.00    Types: Cigarettes    Quit date:  11/21/2004    Years since quitting: 18.6   Smokeless tobacco: Never  Vaping Use   Vaping status: Never Used  Substance and Sexual Activity   Alcohol use: Yes    Comment: social   Drug use: No   Sexual activity: Yes  Other Topics Concern   Not on file  Social History Narrative   Not on file   Social Drivers of Health   Financial Resource Strain: Not on file  Food Insecurity: Not on file  Transportation Needs: Not on file  Physical Activity: Not on file  Stress: Not on file  Social Connections: Not on file        Objective:  Physical Exam: BP 132/84   Pulse 66   Temp (!) 97.5 F (36.4 C) (Temporal)   Ht 5\' 8"  (1.727 m)   Wt 200 lb (90.7  kg)   SpO2 98%   BMI 30.41 kg/m   Body mass index is 30.41 kg/m. Wt Readings from Last 3 Encounters:  07/04/23 200 lb (90.7 kg)  04/09/22 191 lb (86.6 kg)  04/07/21 192 lb (87.1 kg)   Gen: NAD, resting comfortably HEENT: TMs normal bilaterally. OP clear. No thyromegaly noted.  CV: RRR with no murmurs appreciated Pulm: NWOB, CTAB with no crackles, wheezes, or rhonchi GI: Normal bowel sounds present. Soft, Nontender, Nondistended. MSK: no edema, cyanosis, or clubbing noted - Hand: Bony hypertrophy noted throughout most digits more significantly in right second and third MCP and PIP joints.  Pain elicited with axial loading of right second and third digit. Skin: warm, dry Neuro: CN2-12 grossly intact. Strength 5/5 in upper and lower extremities. Reflexes symmetric and intact bilaterally.  Psych: Normal affect and thought content     Kareemah Grounds M. [name], MD 07/04/2023 9:11 AM

## 2023-07-04 NOTE — Assessment & Plan Note (Signed)
Doing well on Synthroid 100 mcg daily.  Check TSH.

## 2023-07-04 NOTE — Patient Instructions (Signed)
It was very nice to see you today!  We will check blood work today.  I will refill your medications.  You have arthritis in your hands.  You can take 1000 mg of Tylenol 3 times daily as needed.  I will also send a prescription in for diclofenac to use as needed.  Let us know in a few weeks how this is working for you.  Please continue to work on diet and exercise.  Return in about 1 year (around 07/03/2024) for Annual Physical.   Take care, Dr [name]  PLEASE NOTE:  If you had any lab tests, please let us know if you have not heard back within a few days. You may see your results on mychart before we have a chance to review them but we will give you a call once they are reviewed by [place].   If we ordered any referrals today, please let us know if you have not heard from their office within the next week.   If you had any urgent prescriptions sent in today, please check with the pharmacy within an hour of our visit to make sure the prescription was transmitted appropriately.   Please try these tips to maintain a healthy lifestyle:  Eat at least 3 REAL meals and 1-2 snacks per day.  Aim for no more than 5 hours between eating.  If you eat breakfast, please do so within one hour of getting up.   Each meal should contain half fruits/vegetables, one quarter protein, and one quarter carbs (no bigger than a computer mouse)  Cut down on sweet beverages. This includes juice, soda, and sweet tea.   Drink at least 1 glass of water with each meal and aim for at least 8 glasses per day  Exercise at least 150 minutes every week.    Preventive Care 61-21 Years Old, Male Preventive care refers to lifestyle choices and visits with your health care provider that can promote health and wellness. Preventive care visits are also called wellness exams. What can I expect for my preventive care visit? Counseling During your preventive care visit, your health care provider may ask about your: Medical  history, including: Past medical problems. Family medical history. Current health, including: Emotional well-being. Home life and relationship well-being. Sexual activity. Lifestyle, including: Alcohol, nicotine or tobacco, and drug use. Access to firearms. Diet, exercise, and sleep habits. Safety issues such as seatbelt and bike helmet use. Sunscreen use. Work and work Astronomer. Physical exam Your health care provider will check your: Height and weight. These may be used to calculate your BMI (body mass index). BMI is a measurement that tells if you are at a healthy weight. Waist circumference. This measures the distance around your waistline. This measurement also tells if you are at a healthy weight and may help predict your risk of certain diseases, such as type 2 diabetes and high blood pressure. Heart rate and blood pressure. Body temperature. Skin for abnormal spots. What immunizations do I need?  Vaccines are usually given at various ages, according to a schedule. Your health care provider will recommend vaccines for you based on your age, medical history, and lifestyle or other factors, such as travel or where you work. What tests do I need? Screening Your health care provider may recommend screening tests for certain conditions. This may include: Lipid and cholesterol levels. Diabetes screening. This is done by checking your blood sugar (glucose) after you have not eaten for a while (fasting). Hepatitis B test.  Hepatitis C test. HIV (human immunodeficiency virus) test. STI (sexually transmitted infection) testing, if you are at risk. Lung cancer screening. Prostate cancer screening. Colorectal cancer screening. Talk with your health care provider about your test results, treatment options, and if necessary, the need for more tests. Follow these instructions at home: Eating and drinking  Eat a diet that includes fresh fruits and vegetables, whole grains, lean  protein, and low-fat dairy products. Take vitamin and mineral supplements as recommended by your health care provider. Do not drink alcohol if your health care provider tells you not to drink. If you drink alcohol: Limit how much you have to 0-2 drinks a day. Know how much alcohol is in your drink. In the [place] one drink equals one 12 oz bottle of beer (355 mL), one 5 oz glass of wine (148 mL), or one 1 oz glass of hard liquor (44 mL). Lifestyle Brush your teeth every morning and night with fluoride toothpaste. Floss one time each day. Exercise for at least 30 minutes 5 or more days each week. Do not use any products that contain nicotine or tobacco. These products include cigarettes, chewing tobacco, and vaping devices, such as e-cigarettes. If you need help quitting, ask your health care provider. Do not use drugs. If you are sexually active, practice safe sex. Use a condom or other form of protection to prevent STIs. Take aspirin only as told by your health care provider. Make sure that you understand how much to take and what form to take. Work with your health care provider to find out whether it is safe and beneficial for you to take aspirin daily. Find healthy ways to manage stress, such as: Meditation, yoga, or listening to music. Journaling. Talking to a trusted person. Spending time with friends and family. Minimize exposure to UV radiation to reduce your risk of skin cancer. Safety Always wear your seat belt while driving or riding in a vehicle. Do not drive: If you have been drinking alcohol. Do not ride with someone who has been drinking. When you are tired or distracted. While texting. If you have been using any mind-altering substances or drugs. Wear a helmet and other protective equipment during sports activities. If you have firearms in your house, make sure you follow all gun safety procedures. What's next? Go to your health care provider once a year for an annual  wellness visit. Ask your health care provider how often you should have your eyes and teeth checked. Stay up to date on all vaccines. This information is not intended to replace advice given to you by your health care provider. Make sure you discuss any questions you have with your health care provider. Document Revised: 12/17/2020 Document Reviewed: 12/17/2020 Elsevier Patient Education  2024 [employer].

## 2023-07-07 ENCOUNTER — Other Ambulatory Visit: Payer: Self-pay | Admitting: *Deleted

## 2023-07-07 DIAGNOSIS — E039 Hypothyroidism, unspecified: Secondary | ICD-10-CM

## 2023-07-07 DIAGNOSIS — R748 Abnormal levels of other serum enzymes: Secondary | ICD-10-CM

## 2023-07-07 MED ORDER — LEVOTHYROXINE SODIUM 112 MCG PO TABS: 112.0000 ug | ORAL_TABLET | Freq: Every day | ORAL | 3 refills | Status: DC

## 2023-07-12 ENCOUNTER — Ambulatory Visit (INDEPENDENT_AMBULATORY_CARE_PROVIDER_SITE_OTHER): Payer: 59 | Admitting: Physician Assistant

## 2023-07-12 VITALS — BP 120/84 | HR 68 | Temp 98.4°F | Ht 68.0 in | Wt 196.4 lb

## 2023-07-12 DIAGNOSIS — R0981 Nasal congestion: Secondary | ICD-10-CM

## 2023-07-12 MED ORDER — AZITHROMYCIN 250 MG PO TABS: ORAL_TABLET | ORAL | 0 refills | Status: AC

## 2023-07-12 NOTE — Progress Notes (Signed)
 Jerry Owens is a 48 y.o. male here for a new problem.  History of Present Illness:   Chief Complaint  Patient presents with   Cough    Pt c/o cough, nasal congestion x 1 week, expectoration green/brown sputum. Chills, headache, sinus pressure ,no fever. Has been using OTC Oscillococcinum for Flu-like symptoms.    Cough/ Nasal Congestion  He complains of a cough and nasal congestion that has persisted for the past week.  Symptoms are similar to previous sinus infections. He is also experiencing accompanying sputum production, chills, headaches, and sinus pressure. His symptoms continue to be unchanged despite taking OTC Oscillococcinum.  Denies any fever.   Past Medical History:  Diagnosis Date   Complication of anesthesia    hard to wake up   Hypothyroidism    PONV (postoperative nausea and vomiting)    Rupture of right distal biceps tendon      Social History   Tobacco Use   Smoking status: Former    Current packs/day: 0.00    Types: Cigarettes    Quit date: 11/21/2004    Years since quitting: 18.6   Smokeless tobacco: Never  Vaping Use   Vaping status: Never Used  Substance Use Topics   Alcohol use: Yes    Comment: social   Drug use: No    Past Surgical History:  Procedure Laterality Date   COLONOSCOPY WITH PROPOFOL  N/A 12/22/2015   Procedure: COLONOSCOPY WITH PROPOFOL ;  Surgeon: Gladis MARLA Louder, MD;  Location: [facility];  Service: Endoscopy;  Laterality: N/A;   DISTAL BICEPS TENDON REPAIR Right 05/18/2017   Procedure: RIGHT DISTAL BICEPS REPAIR;  Surgeon: Jerri Kay HERO, MD;  Location: [facility];  Service: Orthopedics;  Laterality: Right;   DISTAL BICEPS TENDON REPAIR Left 05/28/2019   Procedure: LEFT DISTAL BICEPS REPAIR;  Surgeon: Jerri Kay HERO, MD;  Location: [facility];  Service: Orthopedics;  Laterality: Left;   SHOULDER SURGERY     left rotator cuff surgery    Family History  Problem Relation Age of Onset   Breast  cancer Mother 50   Heart disease Father    Pancreatic cancer Maternal Uncle 55   Throat cancer Maternal Uncle        dx after 66   Pancreatic cancer Maternal Grandmother        dx early 47s   Breast cancer Paternal Grandmother        dx late 50s-early 60s; mets   Stroke Paternal Grandmother    Pancreatic cancer Other        MGM's siblings x3   Breast cancer Other        MGM's sister    Allergies  Allergen Reactions   Amoxicillin Anaphylaxis    Swelling in face, neck, ears, and chest   Penicillins Anaphylaxis    facial and neck swelling    Current Medications:   Current Outpatient Medications:    Ascorbic Acid (VITAMIN C) 100 MG tablet, Take 100 mg by mouth daily., Disp: , Rfl:    diclofenac  (VOLTAREN ) 75 MG EC tablet, Take 1 tablet (75 mg total) by mouth 2 (two) times daily., Disp: 30 tablet, Rfl: 0   finasteride  (PROSCAR ) 5 MG tablet, Take 1 tablet (5 mg total) by mouth daily., Disp: 90 tablet, Rfl: 3   levothyroxine  (SYNTHROID ) 112 MCG tablet, Take 1 tablet (112 mcg total) by mouth daily., Disp: 90 tablet, Rfl: 3   Review of Systems:   Review of Systems  Constitutional:  Positive for chills. Negative for fever.  HENT:  Positive for congestion.   Respiratory:  Positive for cough and sputum production.     Vitals:   Vitals:   07/12/23 1446  BP: 120/84  Pulse: 68  Temp: 98.4 F (36.9 C)  TempSrc: Temporal  SpO2: 98%  Weight: 196 lb 6.1 oz (89.1 kg)  Height: 5' 8 (1.727 m)     Body mass index is 29.86 kg/m.  Physical Exam:   Physical Exam Vitals and nursing note reviewed.  Constitutional:      General: He is not in acute distress.    Appearance: He is well-developed. He is not ill-appearing or toxic-appearing.  HENT:     Head: Normocephalic and atraumatic.     Right Ear: Tympanic membrane, ear canal and external ear normal. Tympanic membrane is not erythematous, retracted or bulging.     Left Ear: Tympanic membrane, ear canal and external ear normal.  Tympanic membrane is not erythematous, retracted or bulging.     Nose: Nose normal.     Right Sinus: No maxillary sinus tenderness or frontal sinus tenderness.     Left Sinus: No maxillary sinus tenderness or frontal sinus tenderness.     Mouth/Throat:     Pharynx: Uvula midline. No posterior oropharyngeal erythema.  Eyes:     General: Lids are normal.     Conjunctiva/sclera: Conjunctivae normal.  Neck:     Trachea: Trachea normal.  Cardiovascular:     Rate and Rhythm: Normal rate and regular rhythm.     Heart sounds: Normal heart sounds, S1 normal and S2 normal.  Pulmonary:     Effort: Pulmonary effort is normal.     Breath sounds: Normal breath sounds. No decreased breath sounds, wheezing, rhonchi or rales.  Lymphadenopathy:     Cervical: No cervical adenopathy.  Skin:    General: Skin is warm and dry.  Neurological:     Mental Status: He is alert.  Psychiatric:        Speech: Speech normal.        Behavior: Behavior normal. Behavior is cooperative.     Assessment and Plan:   Nasal congestion No red flags on exam.   Will initiate azithromycin  per orders.  Discussed taking medications as prescribed.  Reviewed return precautions including new or worsening fever, SOB, new or worsening cough or other concerns.  Push fluids and rest.  I recommend that patient follow-up if symptoms worsen or persist despite treatment x 7-10 days, sooner if needed.   I,Safa M Kadhim,acting as a scribe for [employer], PA.,have documented all relevant documentation on the behalf of Lucie Buttner, PA,as directed by  Lucie Buttner, PA while in the presence of Lucie Buttner, [place].   I, Lucie Buttner, [place], have reviewed all documentation for this visit. The documentation on 07/12/23 for the exam, diagnosis, procedures, and orders are all accurate and complete.   Lucie Buttner, PA-C

## 2023-07-20 ENCOUNTER — Encounter: Payer: Self-pay | Admitting: Physician Assistant

## 2023-07-20 ENCOUNTER — Other Ambulatory Visit: Payer: Self-pay | Admitting: Physician Assistant

## 2023-07-20 MED ORDER — AZELASTINE HCL 0.1 % NA SOLN: 1.0000 | Freq: Two times a day (BID) | NASAL | 0 refills | Status: AC

## 2024-07-06 ENCOUNTER — Encounter: Payer: 59 | Admitting: Family Medicine

## 2024-07-16 ENCOUNTER — Encounter: Admitting: Family Medicine

## 2024-07-30 ENCOUNTER — Other Ambulatory Visit: Payer: Self-pay | Admitting: Family Medicine

## 2024-07-31 ENCOUNTER — Encounter: Admitting: Family Medicine

## 2024-10-23 ENCOUNTER — Encounter: Admitting: Family Medicine
